# Patient Record
Sex: Female | Born: 1950 | Race: White | Hispanic: No
Health system: Southern US, Community
[De-identification: ages and names within clinical notes are randomized; demographics above are authoritative.]

---

## 1999-02-16 ENCOUNTER — Other Ambulatory Visit: Admission: RE | Admit: 1999-02-16 | Discharge: 1999-02-16 | Payer: Self-pay | Admitting: Obstetrics and Gynecology

## 2000-03-22 ENCOUNTER — Other Ambulatory Visit: Admission: RE | Admit: 2000-03-22 | Discharge: 2000-03-22 | Payer: Self-pay | Admitting: Obstetrics and Gynecology

## 2001-05-02 ENCOUNTER — Other Ambulatory Visit: Admission: RE | Admit: 2001-05-02 | Discharge: 2001-05-02 | Payer: Self-pay | Admitting: Obstetrics and Gynecology

## 2002-06-12 ENCOUNTER — Other Ambulatory Visit: Admission: RE | Admit: 2002-06-12 | Discharge: 2002-06-12 | Payer: Self-pay | Admitting: Obstetrics and Gynecology

## 2003-06-16 ENCOUNTER — Other Ambulatory Visit: Admission: RE | Admit: 2003-06-16 | Discharge: 2003-06-16 | Payer: Self-pay | Admitting: Obstetrics and Gynecology

## 2004-06-23 ENCOUNTER — Other Ambulatory Visit: Admission: RE | Admit: 2004-06-23 | Discharge: 2004-06-23 | Payer: Self-pay | Admitting: Obstetrics and Gynecology

## 2006-04-02 ENCOUNTER — Ambulatory Visit: Payer: Self-pay | Admitting: Internal Medicine

## 2006-04-04 ENCOUNTER — Ambulatory Visit: Payer: Self-pay | Admitting: Internal Medicine

## 2006-04-09 ENCOUNTER — Ambulatory Visit: Payer: Self-pay | Admitting: Internal Medicine

## 2006-04-09 LAB — CONVERTED CEMR LAB
ALT: 14 units/L (ref 0–40)
AST: 15 units/L (ref 0–37)
BUN: 10 mg/dL (ref 6–23)
Basophils Absolute: 0 10*3/uL (ref 0.0–0.1)
Calcium: 9.2 mg/dL (ref 8.4–10.5)
Chloride: 110 meq/L (ref 96–112)
Eosinophils Absolute: 0.1 10*3/uL (ref 0.0–0.6)
Eosinophils Relative: 2 % (ref 0.0–5.0)
GFR calc Af Amer: 133 mL/min
GFR calc non Af Amer: 110 mL/min
HCT: 38 % (ref 36.0–46.0)
HDL: 69.7 mg/dL (ref >39.0)
LDL Cholesterol: 91 mg/dL (ref 0–99)
Platelets: 239 10*3/uL (ref 150–400)
Potassium: 4 meq/L (ref 3.5–5.1)
Triglycerides: 52 mg/dL (ref 0–149)
VLDL: 10 mg/dL (ref 0–40)
Vit D, 1,25-Dihydroxy: 13 — ABNORMAL LOW (ref 20–57)
WBC: 2.7 10*3/uL — ABNORMAL LOW (ref 4.5–10.5)

## 2006-04-19 ENCOUNTER — Ambulatory Visit: Payer: Self-pay | Admitting: Internal Medicine

## 2006-04-19 LAB — CONVERTED CEMR LAB
Free T4: 0.7 ng/dL (ref 0.6–1.6)
Hgb A1c MFr Bld: 5.1 % (ref 4.6–6.0)
T3, Free: 3.4 pg/mL (ref 2.3–4.2)

## 2006-05-03 ENCOUNTER — Ambulatory Visit: Payer: Self-pay | Admitting: Internal Medicine

## 2006-05-08 ENCOUNTER — Encounter: Admission: RE | Admit: 2006-05-08 | Discharge: 2006-05-08 | Payer: Self-pay | Admitting: Internal Medicine

## 2006-05-13 ENCOUNTER — Ambulatory Visit: Payer: Self-pay | Admitting: Gastroenterology

## 2006-05-21 ENCOUNTER — Encounter: Payer: Self-pay | Admitting: Internal Medicine

## 2006-05-21 ENCOUNTER — Ambulatory Visit: Payer: Self-pay | Admitting: Internal Medicine

## 2006-05-23 DIAGNOSIS — C4370 Malignant melanoma of unspecified lower limb, including hip: Secondary | ICD-10-CM | POA: Insufficient documentation

## 2006-05-24 ENCOUNTER — Encounter: Payer: Self-pay | Admitting: Internal Medicine

## 2006-05-24 ENCOUNTER — Ambulatory Visit: Payer: Self-pay | Admitting: Gastroenterology

## 2006-05-24 ENCOUNTER — Encounter: Payer: Self-pay | Admitting: Gastroenterology

## 2006-06-27 ENCOUNTER — Encounter: Admission: RE | Admit: 2006-06-27 | Discharge: 2006-06-27 | Payer: Self-pay | Admitting: Internal Medicine

## 2006-06-29 ENCOUNTER — Encounter: Payer: Self-pay | Admitting: Internal Medicine

## 2006-06-29 DIAGNOSIS — E0590 Thyrotoxicosis, unspecified without thyrotoxic crisis or storm: Secondary | ICD-10-CM | POA: Insufficient documentation

## 2006-07-01 ENCOUNTER — Encounter (INDEPENDENT_AMBULATORY_CARE_PROVIDER_SITE_OTHER): Payer: Self-pay | Admitting: *Deleted

## 2006-07-01 ENCOUNTER — Encounter: Admission: RE | Admit: 2006-07-01 | Discharge: 2006-07-01 | Payer: Self-pay | Admitting: Internal Medicine

## 2006-07-11 ENCOUNTER — Ambulatory Visit: Payer: Self-pay | Admitting: Internal Medicine

## 2006-07-11 DIAGNOSIS — R51 Headache: Secondary | ICD-10-CM | POA: Insufficient documentation

## 2006-07-11 DIAGNOSIS — E559 Vitamin D deficiency, unspecified: Secondary | ICD-10-CM | POA: Insufficient documentation

## 2006-07-11 DIAGNOSIS — R42 Dizziness and giddiness: Secondary | ICD-10-CM | POA: Insufficient documentation

## 2006-07-11 DIAGNOSIS — R519 Headache, unspecified: Secondary | ICD-10-CM | POA: Insufficient documentation

## 2006-07-16 ENCOUNTER — Encounter (INDEPENDENT_AMBULATORY_CARE_PROVIDER_SITE_OTHER): Payer: Self-pay | Admitting: *Deleted

## 2006-08-12 ENCOUNTER — Telehealth: Payer: Self-pay | Admitting: Internal Medicine

## 2006-08-14 ENCOUNTER — Telehealth: Payer: Self-pay | Admitting: Internal Medicine

## 2006-08-15 ENCOUNTER — Ambulatory Visit: Payer: Self-pay | Admitting: Internal Medicine

## 2006-08-17 ENCOUNTER — Encounter: Payer: Self-pay | Admitting: Internal Medicine

## 2006-08-19 ENCOUNTER — Encounter (INDEPENDENT_AMBULATORY_CARE_PROVIDER_SITE_OTHER): Payer: Self-pay | Admitting: *Deleted

## 2006-12-02 ENCOUNTER — Telehealth (INDEPENDENT_AMBULATORY_CARE_PROVIDER_SITE_OTHER): Payer: Self-pay | Admitting: *Deleted

## 2006-12-02 DIAGNOSIS — E039 Hypothyroidism, unspecified: Secondary | ICD-10-CM | POA: Insufficient documentation

## 2006-12-04 ENCOUNTER — Ambulatory Visit: Payer: Self-pay | Admitting: Internal Medicine

## 2006-12-10 ENCOUNTER — Ambulatory Visit: Payer: Self-pay | Admitting: Internal Medicine

## 2007-02-17 ENCOUNTER — Telehealth (INDEPENDENT_AMBULATORY_CARE_PROVIDER_SITE_OTHER): Payer: Self-pay | Admitting: *Deleted

## 2007-03-21 ENCOUNTER — Ambulatory Visit: Payer: Self-pay | Admitting: Internal Medicine

## 2007-03-22 LAB — CONVERTED CEMR LAB: TSH: 5.5 microintl units/mL (ref 0.35–5.50)

## 2007-03-24 ENCOUNTER — Encounter (INDEPENDENT_AMBULATORY_CARE_PROVIDER_SITE_OTHER): Payer: Self-pay | Admitting: *Deleted

## 2007-03-25 LAB — CONVERTED CEMR LAB: Vit D, 1,25-Dihydroxy: 29 — ABNORMAL LOW (ref 30–89)

## 2007-03-26 ENCOUNTER — Encounter (INDEPENDENT_AMBULATORY_CARE_PROVIDER_SITE_OTHER): Payer: Self-pay | Admitting: *Deleted

## 2007-03-31 ENCOUNTER — Ambulatory Visit: Payer: Self-pay | Admitting: Internal Medicine

## 2007-03-31 DIAGNOSIS — K219 Gastro-esophageal reflux disease without esophagitis: Secondary | ICD-10-CM | POA: Insufficient documentation

## 2007-04-25 ENCOUNTER — Encounter (INDEPENDENT_AMBULATORY_CARE_PROVIDER_SITE_OTHER): Payer: Self-pay | Admitting: *Deleted

## 2007-05-20 ENCOUNTER — Encounter: Payer: Self-pay | Admitting: Internal Medicine

## 2007-05-23 ENCOUNTER — Telehealth (INDEPENDENT_AMBULATORY_CARE_PROVIDER_SITE_OTHER): Payer: Self-pay | Admitting: *Deleted

## 2007-06-20 ENCOUNTER — Encounter: Payer: Self-pay | Admitting: Internal Medicine

## 2007-12-18 ENCOUNTER — Encounter: Payer: Self-pay | Admitting: Internal Medicine

## 2008-11-19 ENCOUNTER — Ambulatory Visit (HOSPITAL_COMMUNITY): Admission: RE | Admit: 2008-11-19 | Discharge: 2008-11-19 | Payer: Self-pay | Admitting: Obstetrics and Gynecology

## 2009-04-29 ENCOUNTER — Encounter (INDEPENDENT_AMBULATORY_CARE_PROVIDER_SITE_OTHER): Payer: Self-pay | Admitting: *Deleted

## 2009-08-03 ENCOUNTER — Encounter: Admission: RE | Admit: 2009-08-03 | Discharge: 2009-08-03 | Payer: Self-pay | Admitting: Family Medicine

## 2010-01-31 NOTE — Letter (Signed)
Summary: Colonoscopy Letter  [facility]  [address] [place], [place] [address]   Phone: [phone]  Fax: [phone]      April 29, 2009 MRN: [id number]   MARLISSA EMERICK [address] [place], [place]  [address]   Dear Ms. CHRISTLEY,   According to your medical record, it is time for you to schedule a Colonoscopy. The American Cancer Society recommends this procedure as a method to detect early colon cancer. Patients with a family history of colon cancer, or a personal history of colon polyps or inflammatory bowel disease are at increased risk.  This letter has beeen generated based on the recommendations made at the time of your procedure. If you feel that in your particular situation this may no longer apply, please contact our office.  Please call our office at [phone] to schedule this appointment or to update your records at your earliest convenience.  Thank you for cooperating with [place] to provide you with the very best care possible.   Sincerely,  Judie Petit T. Russella Dar, M.D.  [facility] [phone]

## 2010-03-03 ENCOUNTER — Other Ambulatory Visit: Payer: Self-pay | Admitting: Obstetrics and Gynecology

## 2010-04-05 LAB — CBC
HCT: 37.4 % (ref 36.0–46.0)
Hemoglobin: 12.9 g/dL (ref 12.0–15.0)
MCV: 97.9 fL (ref 78.0–100.0)
Platelets: 236 10*3/uL (ref 150–400)
RBC: 3.82 MIL/uL — ABNORMAL LOW (ref 3.87–5.11)
RDW: 13 % (ref 11.5–15.5)
WBC: 9.9 10*3/uL (ref 4.0–10.5)

## 2010-04-05 LAB — BASIC METABOLIC PANEL
Creatinine, Ser: 0.79 mg/dL (ref 0.4–1.2)
Glucose, Bld: 92 mg/dL (ref 70–99)
Sodium: 139 mEq/L (ref 135–145)

## 2010-04-05 LAB — PROTIME-INR
INR: 1.05 (ref 0.00–1.49)
Prothrombin Time: 13.6 seconds (ref 11.6–15.2)

## 2010-05-19 NOTE — Assessment & Plan Note (Signed)
[facility]                        [place] OFFICE NOTE   Erin, Parks                     MRN:          [id number]  DATE:04/04/2006                            DOB:          [birth year]    Erin Parks was seen April 04, 2006 to establish as a new patient  and for a physical exam.   She is concerned about her general health and completing some  surveillance studies. 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 Additionally she has never had a bone density. She is not on  vitamin D or calcium.   She has a significant past history. At age 4 she broke her right ankle &  while wearing the cast  she fell on a chair and broke her hip. The cast  apparently resulted in some skin change requiring skin grafting. She  also in college had some tendon surgery of the right foot to address  complications of  the prior orthopedic issues mentioned above. She is  gravida 2, para 2. She had one C section. 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 She had no recent  Derm surveys as there  were no  new skin lesions for over 10 years.   FAMILY HISTORY:  Negative for diabetes and stroke. Her father had  bladder cancer; he had a pacemaker. There is a family history of  hypertension. Her mother had hypertension later in life.   She smoked from 1968 to 1980 less than a half pack per day. She has 1-2  glasses of wine daily on average.   She is intolerant or allergic to SULFA and CODEINE.   She is on Prempro from Dr. Miguel Aschoff.   Her weight has fluctuated some related to holidays and diet change.   She does exercise 4 days a week walking and running at least 30 minutes  without cardiopulmonary symptoms.   She is on no specific diet.   She does have chronic paresthesias of the second and third toes of the  right foot  since the ortho  surgery.   She also has symptoms of serous otitis on the right related to  respiratory tract infection in December 2007.   The remainder of the review of systems is essentially negative.   GENERAL:  She is 5 foot 3 and weighs 122 fully clothed.  VITAL SIGNS:  Pulse was 60 and regular, respiratory rate 14 and blood  pressure initially was 140/94.  Fundal exam revealed no abnormal vasculature. Nares were patent. Dental  hygiene is excellent. The tympanic membrane is dull but there is no  evidence of infection or inflammation.  LYMPH:  She has no lymphadenopathy about the head, neck or axilla.  NECK:  The thyroid is normal size but slightly asymmetric with the right  lobe greater than the left. I cannot appreciate any nodules.  HEART:  She does have a grade 1 crescendo-decrescendo murmur loudest at  the apex.  CHEST:  Clear with no increased work  of breathing.  ABDOMEN:  She has no organomegaly or masses.  EXTREMITIES:  Range of motion is normal. All pulses are intact and there  is no edema. There is evidence of a prior surgery with some scarring  over the anterior aspect of the right ankle.  NEUROPSYCHIATRIC:  Normal.   Her EKG is normal.   Fasting labs would be recommended; by history she has had normal glucose  and lipids but these were done remotely.   Additionally surveillance studies will be scheduled for colonoscopy and  bone density.   She will be asked to monitor her blood pressure. 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 If the blood pressure remains elevated at  school i.e. over 130/85, then I would recommend purchasing a home cuff  and monitoring it.   The heart murmur simply needs monitoring annually. It is not a click  murmur and her dentist may wish to give her SBE prophylaxis.   Chronic serous otitis changes of the right ear would most likely require  placement of an otic tube if this is problematic enough for her.   Copies of fasting labs will be  sent to her for her home file. A goal  sheet will be provided.     Titus Dubin. Alwyn Ren, MD,FACP,FCCP  Electronically Signed    [facility]  DD: 04/04/2006  DT: 04/04/2006  Job #: [id number]

## 2010-07-21 ENCOUNTER — Telehealth: Payer: Self-pay

## 2010-07-27 NOTE — Telephone Encounter (Signed)
From: Marylynn Pearson    Sent: 07/27/2010   8:59 AM      To: Christie Nottingham, CMA  Patient switched gi doctors.

## 2011-03-06 ENCOUNTER — Other Ambulatory Visit: Payer: Self-pay | Admitting: Gastroenterology

## 2011-03-06 DIAGNOSIS — F458 Other somatoform disorders: Secondary | ICD-10-CM

## 2011-03-08 ENCOUNTER — Ambulatory Visit
Admission: RE | Admit: 2011-03-08 | Discharge: 2011-03-08 | Disposition: A | Discharge status: Home / Self Care | Payer: BC Managed Care – PPO | Source: Ambulatory Visit | Attending: Gastroenterology | Admitting: Gastroenterology

## 2011-03-08 DIAGNOSIS — R0989 Other specified symptoms and signs involving the circulatory and respiratory systems: Secondary | ICD-10-CM

## 2011-03-08 DIAGNOSIS — F458 Other somatoform disorders: Secondary | ICD-10-CM

## 2011-03-08 DIAGNOSIS — R198 Other specified symptoms and signs involving the digestive system and abdomen: Secondary | ICD-10-CM

## 2012-05-21 ENCOUNTER — Other Ambulatory Visit: Payer: Self-pay | Admitting: Obstetrics and Gynecology

## 2012-05-21 DIAGNOSIS — R928 Other abnormal and inconclusive findings on diagnostic imaging of breast: Secondary | ICD-10-CM

## 2012-05-23 ENCOUNTER — Ambulatory Visit
Admission: RE | Admit: 2012-05-23 | Discharge: 2012-05-23 | Disposition: A | Discharge status: Home / Self Care | Payer: BC Managed Care – PPO | Source: Ambulatory Visit | Attending: Obstetrics and Gynecology | Admitting: Obstetrics and Gynecology

## 2012-05-23 DIAGNOSIS — R928 Other abnormal and inconclusive findings on diagnostic imaging of breast: Secondary | ICD-10-CM

## 2012-06-03 ENCOUNTER — Other Ambulatory Visit: Payer: BC Managed Care – PPO

## 2012-12-31 ENCOUNTER — Other Ambulatory Visit: Payer: Self-pay | Admitting: Obstetrics and Gynecology

## 2012-12-31 DIAGNOSIS — N6310 Unspecified lump in the right breast, unspecified quadrant: Secondary | ICD-10-CM

## 2013-01-07 ENCOUNTER — Ambulatory Visit
Admission: RE | Admit: 2013-01-07 | Discharge: 2013-01-07 | Disposition: A | Discharge status: Home / Self Care | Payer: BC Managed Care – PPO | Source: Ambulatory Visit | Attending: Obstetrics and Gynecology | Admitting: Obstetrics and Gynecology

## 2013-01-07 ENCOUNTER — Other Ambulatory Visit: Payer: Self-pay | Admitting: Obstetrics and Gynecology

## 2013-01-07 DIAGNOSIS — N6310 Unspecified lump in the right breast, unspecified quadrant: Secondary | ICD-10-CM

## 2015-07-20 ENCOUNTER — Other Ambulatory Visit: Payer: Self-pay | Admitting: Obstetrics and Gynecology

## 2015-07-21 LAB — CYTOLOGY - PAP

## 2016-02-08 DIAGNOSIS — G4700 Insomnia, unspecified: Secondary | ICD-10-CM | POA: Diagnosis not present

## 2016-02-08 DIAGNOSIS — Z0000 Encounter for general adult medical examination without abnormal findings: Secondary | ICD-10-CM | POA: Diagnosis not present

## 2016-02-08 DIAGNOSIS — E7800 Pure hypercholesterolemia, unspecified: Secondary | ICD-10-CM | POA: Diagnosis not present

## 2016-02-08 DIAGNOSIS — I10 Essential (primary) hypertension: Secondary | ICD-10-CM | POA: Diagnosis not present

## 2016-03-26 DIAGNOSIS — R69 Illness, unspecified: Secondary | ICD-10-CM | POA: Diagnosis not present

## 2016-03-26 DIAGNOSIS — I10 Essential (primary) hypertension: Secondary | ICD-10-CM | POA: Diagnosis not present

## 2016-04-02 DIAGNOSIS — R69 Illness, unspecified: Secondary | ICD-10-CM | POA: Diagnosis not present

## 2016-04-17 DIAGNOSIS — L989 Disorder of the skin and subcutaneous tissue, unspecified: Secondary | ICD-10-CM | POA: Diagnosis not present

## 2016-04-25 DIAGNOSIS — I10 Essential (primary) hypertension: Secondary | ICD-10-CM | POA: Diagnosis not present

## 2016-04-25 DIAGNOSIS — R229 Localized swelling, mass and lump, unspecified: Secondary | ICD-10-CM | POA: Diagnosis not present

## 2016-04-26 DIAGNOSIS — H5213 Myopia, bilateral: Secondary | ICD-10-CM | POA: Diagnosis not present

## 2016-04-26 DIAGNOSIS — Z0100 Encounter for examination of eyes and vision without abnormal findings: Secondary | ICD-10-CM | POA: Diagnosis not present

## 2016-05-23 DIAGNOSIS — Z86010 Personal history of colonic polyps: Secondary | ICD-10-CM | POA: Diagnosis not present

## 2016-05-31 DIAGNOSIS — Z0100 Encounter for examination of eyes and vision without abnormal findings: Secondary | ICD-10-CM | POA: Diagnosis not present

## 2016-06-18 DIAGNOSIS — E890 Postprocedural hypothyroidism: Secondary | ICD-10-CM | POA: Diagnosis not present

## 2016-06-18 DIAGNOSIS — E559 Vitamin D deficiency, unspecified: Secondary | ICD-10-CM | POA: Diagnosis not present

## 2016-06-18 DIAGNOSIS — I10 Essential (primary) hypertension: Secondary | ICD-10-CM | POA: Diagnosis not present

## 2016-06-25 DIAGNOSIS — I10 Essential (primary) hypertension: Secondary | ICD-10-CM | POA: Diagnosis not present

## 2016-06-25 DIAGNOSIS — E890 Postprocedural hypothyroidism: Secondary | ICD-10-CM | POA: Diagnosis not present

## 2016-06-25 DIAGNOSIS — E559 Vitamin D deficiency, unspecified: Secondary | ICD-10-CM | POA: Diagnosis not present

## 2016-08-23 DIAGNOSIS — D1801 Hemangioma of skin and subcutaneous tissue: Secondary | ICD-10-CM | POA: Diagnosis not present

## 2016-08-23 DIAGNOSIS — D225 Melanocytic nevi of trunk: Secondary | ICD-10-CM | POA: Diagnosis not present

## 2016-08-23 DIAGNOSIS — L821 Other seborrheic keratosis: Secondary | ICD-10-CM | POA: Diagnosis not present

## 2016-08-23 DIAGNOSIS — L814 Other melanin hyperpigmentation: Secondary | ICD-10-CM | POA: Diagnosis not present

## 2016-08-23 DIAGNOSIS — Z85820 Personal history of malignant melanoma of skin: Secondary | ICD-10-CM | POA: Diagnosis not present

## 2016-08-24 DIAGNOSIS — Z124 Encounter for screening for malignant neoplasm of cervix: Secondary | ICD-10-CM | POA: Diagnosis not present

## 2016-08-24 DIAGNOSIS — Z01419 Encounter for gynecological examination (general) (routine) without abnormal findings: Secondary | ICD-10-CM | POA: Diagnosis not present

## 2016-08-24 DIAGNOSIS — Z6823 Body mass index (BMI) 23.0-23.9, adult: Secondary | ICD-10-CM | POA: Diagnosis not present

## 2016-08-24 DIAGNOSIS — Z1231 Encounter for screening mammogram for malignant neoplasm of breast: Secondary | ICD-10-CM | POA: Diagnosis not present

## 2016-10-22 DIAGNOSIS — Z23 Encounter for immunization: Secondary | ICD-10-CM | POA: Diagnosis not present

## 2016-10-22 DIAGNOSIS — G4700 Insomnia, unspecified: Secondary | ICD-10-CM | POA: Diagnosis not present

## 2016-10-22 DIAGNOSIS — I10 Essential (primary) hypertension: Secondary | ICD-10-CM | POA: Diagnosis not present

## 2017-02-18 DIAGNOSIS — G4700 Insomnia, unspecified: Secondary | ICD-10-CM | POA: Diagnosis not present

## 2017-02-18 DIAGNOSIS — I10 Essential (primary) hypertension: Secondary | ICD-10-CM | POA: Diagnosis not present

## 2017-02-18 DIAGNOSIS — Z1159 Encounter for screening for other viral diseases: Secondary | ICD-10-CM | POA: Diagnosis not present

## 2017-02-18 DIAGNOSIS — Z0000 Encounter for general adult medical examination without abnormal findings: Secondary | ICD-10-CM | POA: Diagnosis not present

## 2017-02-18 DIAGNOSIS — E7800 Pure hypercholesterolemia, unspecified: Secondary | ICD-10-CM | POA: Diagnosis not present

## 2017-02-18 DIAGNOSIS — H9190 Unspecified hearing loss, unspecified ear: Secondary | ICD-10-CM | POA: Diagnosis not present

## 2017-02-18 DIAGNOSIS — Z658 Other specified problems related to psychosocial circumstances: Secondary | ICD-10-CM | POA: Diagnosis not present

## 2017-03-27 DIAGNOSIS — K130 Diseases of lips: Secondary | ICD-10-CM | POA: Diagnosis not present

## 2017-04-05 DIAGNOSIS — K130 Diseases of lips: Secondary | ICD-10-CM | POA: Diagnosis not present

## 2017-06-03 DIAGNOSIS — H9313 Tinnitus, bilateral: Secondary | ICD-10-CM | POA: Diagnosis not present

## 2017-06-03 DIAGNOSIS — H903 Sensorineural hearing loss, bilateral: Secondary | ICD-10-CM | POA: Diagnosis not present

## 2017-06-05 ENCOUNTER — Other Ambulatory Visit: Payer: Self-pay | Admitting: Otolaryngology

## 2017-06-05 DIAGNOSIS — H9313 Tinnitus, bilateral: Secondary | ICD-10-CM

## 2017-06-05 DIAGNOSIS — H903 Sensorineural hearing loss, bilateral: Secondary | ICD-10-CM

## 2017-06-13 DIAGNOSIS — H3540 Unspecified peripheral retinal degeneration: Secondary | ICD-10-CM | POA: Diagnosis not present

## 2017-06-13 DIAGNOSIS — H524 Presbyopia: Secondary | ICD-10-CM | POA: Diagnosis not present

## 2017-06-13 DIAGNOSIS — Z0100 Encounter for examination of eyes and vision without abnormal findings: Secondary | ICD-10-CM | POA: Diagnosis not present

## 2017-06-13 DIAGNOSIS — H43393 Other vitreous opacities, bilateral: Secondary | ICD-10-CM | POA: Diagnosis not present

## 2017-06-13 DIAGNOSIS — H25813 Combined forms of age-related cataract, bilateral: Secondary | ICD-10-CM | POA: Diagnosis not present

## 2017-06-13 DIAGNOSIS — H5203 Hypermetropia, bilateral: Secondary | ICD-10-CM | POA: Diagnosis not present

## 2017-06-13 DIAGNOSIS — H52223 Regular astigmatism, bilateral: Secondary | ICD-10-CM | POA: Diagnosis not present

## 2017-06-14 ENCOUNTER — Ambulatory Visit
Admission: RE | Admit: 2017-06-14 | Discharge: 2017-06-14 | Disposition: A | Discharge status: Home / Self Care | Payer: Medicare HMO | Source: Ambulatory Visit | Attending: Otolaryngology | Admitting: Otolaryngology

## 2017-06-14 DIAGNOSIS — H9313 Tinnitus, bilateral: Secondary | ICD-10-CM

## 2017-06-14 DIAGNOSIS — H903 Sensorineural hearing loss, bilateral: Secondary | ICD-10-CM

## 2017-06-14 MED ORDER — GADOBENATE DIMEGLUMINE 529 MG/ML IV SOLN
10.0000 mL | Freq: Once | INTRAVENOUS | Status: AC | PRN
  Administered 2017-06-14: 10 mL via INTRAVENOUS

## 2017-06-18 DIAGNOSIS — I10 Essential (primary) hypertension: Secondary | ICD-10-CM | POA: Diagnosis not present

## 2017-06-18 DIAGNOSIS — E559 Vitamin D deficiency, unspecified: Secondary | ICD-10-CM | POA: Diagnosis not present

## 2017-06-18 DIAGNOSIS — E890 Postprocedural hypothyroidism: Secondary | ICD-10-CM | POA: Diagnosis not present

## 2017-06-25 DIAGNOSIS — M2550 Pain in unspecified joint: Secondary | ICD-10-CM | POA: Diagnosis not present

## 2017-06-25 DIAGNOSIS — E559 Vitamin D deficiency, unspecified: Secondary | ICD-10-CM | POA: Diagnosis not present

## 2017-06-25 DIAGNOSIS — I10 Essential (primary) hypertension: Secondary | ICD-10-CM | POA: Diagnosis not present

## 2017-06-25 DIAGNOSIS — E890 Postprocedural hypothyroidism: Secondary | ICD-10-CM | POA: Diagnosis not present

## 2017-06-28 DIAGNOSIS — M25562 Pain in left knee: Secondary | ICD-10-CM | POA: Diagnosis not present

## 2017-06-28 DIAGNOSIS — M25551 Pain in right hip: Secondary | ICD-10-CM | POA: Diagnosis not present

## 2017-06-28 DIAGNOSIS — M545 Low back pain: Secondary | ICD-10-CM | POA: Diagnosis not present

## 2017-06-28 DIAGNOSIS — M47817 Spondylosis without myelopathy or radiculopathy, lumbosacral region: Secondary | ICD-10-CM | POA: Diagnosis not present

## 2017-06-28 DIAGNOSIS — M25561 Pain in right knee: Secondary | ICD-10-CM | POA: Diagnosis not present

## 2017-07-19 DIAGNOSIS — M47817 Spondylosis without myelopathy or radiculopathy, lumbosacral region: Secondary | ICD-10-CM | POA: Diagnosis not present

## 2017-07-19 DIAGNOSIS — M25561 Pain in right knee: Secondary | ICD-10-CM | POA: Diagnosis not present

## 2017-07-19 DIAGNOSIS — M25551 Pain in right hip: Secondary | ICD-10-CM | POA: Diagnosis not present

## 2017-07-19 DIAGNOSIS — M25562 Pain in left knee: Secondary | ICD-10-CM | POA: Diagnosis not present

## 2017-08-05 DIAGNOSIS — S60021A Contusion of right index finger without damage to nail, initial encounter: Secondary | ICD-10-CM | POA: Diagnosis not present

## 2017-08-20 DIAGNOSIS — M47817 Spondylosis without myelopathy or radiculopathy, lumbosacral region: Secondary | ICD-10-CM | POA: Diagnosis not present

## 2017-08-20 DIAGNOSIS — M545 Low back pain: Secondary | ICD-10-CM | POA: Diagnosis not present

## 2017-08-26 DIAGNOSIS — D1801 Hemangioma of skin and subcutaneous tissue: Secondary | ICD-10-CM | POA: Diagnosis not present

## 2017-08-26 DIAGNOSIS — L821 Other seborrheic keratosis: Secondary | ICD-10-CM | POA: Diagnosis not present

## 2017-08-26 DIAGNOSIS — D225 Melanocytic nevi of trunk: Secondary | ICD-10-CM | POA: Diagnosis not present

## 2017-08-26 DIAGNOSIS — L814 Other melanin hyperpigmentation: Secondary | ICD-10-CM | POA: Diagnosis not present

## 2017-08-26 DIAGNOSIS — Z85820 Personal history of malignant melanoma of skin: Secondary | ICD-10-CM | POA: Diagnosis not present

## 2017-08-26 DIAGNOSIS — Z1231 Encounter for screening mammogram for malignant neoplasm of breast: Secondary | ICD-10-CM | POA: Diagnosis not present

## 2017-08-26 DIAGNOSIS — B351 Tinea unguium: Secondary | ICD-10-CM | POA: Diagnosis not present

## 2017-08-28 DIAGNOSIS — M545 Low back pain: Secondary | ICD-10-CM | POA: Diagnosis not present

## 2017-09-04 DIAGNOSIS — M545 Low back pain: Secondary | ICD-10-CM | POA: Diagnosis not present

## 2017-09-12 DIAGNOSIS — M545 Low back pain: Secondary | ICD-10-CM | POA: Diagnosis not present

## 2017-09-16 DIAGNOSIS — M545 Low back pain: Secondary | ICD-10-CM | POA: Diagnosis not present

## 2017-09-18 DIAGNOSIS — M545 Low back pain: Secondary | ICD-10-CM | POA: Diagnosis not present

## 2017-09-25 DIAGNOSIS — M545 Low back pain: Secondary | ICD-10-CM | POA: Diagnosis not present

## 2018-01-07 DIAGNOSIS — R69 Illness, unspecified: Secondary | ICD-10-CM | POA: Diagnosis not present

## 2018-02-28 DIAGNOSIS — I10 Essential (primary) hypertension: Secondary | ICD-10-CM | POA: Diagnosis not present

## 2018-02-28 DIAGNOSIS — H9190 Unspecified hearing loss, unspecified ear: Secondary | ICD-10-CM | POA: Diagnosis not present

## 2018-02-28 DIAGNOSIS — E039 Hypothyroidism, unspecified: Secondary | ICD-10-CM | POA: Diagnosis not present

## 2018-02-28 DIAGNOSIS — G4700 Insomnia, unspecified: Secondary | ICD-10-CM | POA: Diagnosis not present

## 2018-02-28 DIAGNOSIS — E7800 Pure hypercholesterolemia, unspecified: Secondary | ICD-10-CM | POA: Diagnosis not present

## 2018-02-28 DIAGNOSIS — Z0000 Encounter for general adult medical examination without abnormal findings: Secondary | ICD-10-CM | POA: Diagnosis not present

## 2018-06-19 DIAGNOSIS — E890 Postprocedural hypothyroidism: Secondary | ICD-10-CM | POA: Diagnosis not present

## 2018-06-19 DIAGNOSIS — I10 Essential (primary) hypertension: Secondary | ICD-10-CM | POA: Diagnosis not present

## 2018-06-19 DIAGNOSIS — E559 Vitamin D deficiency, unspecified: Secondary | ICD-10-CM | POA: Diagnosis not present

## 2018-06-26 DIAGNOSIS — E890 Postprocedural hypothyroidism: Secondary | ICD-10-CM | POA: Diagnosis not present

## 2018-06-26 DIAGNOSIS — E559 Vitamin D deficiency, unspecified: Secondary | ICD-10-CM | POA: Diagnosis not present

## 2018-06-26 DIAGNOSIS — I10 Essential (primary) hypertension: Secondary | ICD-10-CM | POA: Diagnosis not present

## 2018-06-26 DIAGNOSIS — N951 Menopausal and female climacteric states: Secondary | ICD-10-CM | POA: Diagnosis not present

## 2018-07-02 DIAGNOSIS — M8589 Other specified disorders of bone density and structure, multiple sites: Secondary | ICD-10-CM | POA: Diagnosis not present

## 2018-08-26 DIAGNOSIS — Z85820 Personal history of malignant melanoma of skin: Secondary | ICD-10-CM | POA: Diagnosis not present

## 2018-08-26 DIAGNOSIS — D225 Melanocytic nevi of trunk: Secondary | ICD-10-CM | POA: Diagnosis not present

## 2018-08-26 DIAGNOSIS — L603 Nail dystrophy: Secondary | ICD-10-CM | POA: Diagnosis not present

## 2018-08-26 DIAGNOSIS — L820 Inflamed seborrheic keratosis: Secondary | ICD-10-CM | POA: Diagnosis not present

## 2018-08-26 DIAGNOSIS — L821 Other seborrheic keratosis: Secondary | ICD-10-CM | POA: Diagnosis not present

## 2018-08-26 DIAGNOSIS — L814 Other melanin hyperpigmentation: Secondary | ICD-10-CM | POA: Diagnosis not present

## 2018-08-26 DIAGNOSIS — L720 Epidermal cyst: Secondary | ICD-10-CM | POA: Diagnosis not present

## 2018-08-26 DIAGNOSIS — L570 Actinic keratosis: Secondary | ICD-10-CM | POA: Diagnosis not present

## 2018-09-03 DIAGNOSIS — Z1231 Encounter for screening mammogram for malignant neoplasm of breast: Secondary | ICD-10-CM | POA: Diagnosis not present

## 2018-09-03 DIAGNOSIS — Z01419 Encounter for gynecological examination (general) (routine) without abnormal findings: Secondary | ICD-10-CM | POA: Diagnosis not present

## 2018-09-25 DIAGNOSIS — H524 Presbyopia: Secondary | ICD-10-CM | POA: Diagnosis not present

## 2018-09-25 DIAGNOSIS — H5203 Hypermetropia, bilateral: Secondary | ICD-10-CM | POA: Diagnosis not present

## 2018-09-25 DIAGNOSIS — H25813 Combined forms of age-related cataract, bilateral: Secondary | ICD-10-CM | POA: Diagnosis not present

## 2018-09-25 DIAGNOSIS — H52223 Regular astigmatism, bilateral: Secondary | ICD-10-CM | POA: Diagnosis not present

## 2019-01-26 ENCOUNTER — Ambulatory Visit: Payer: BC Managed Care – PPO | Attending: Internal Medicine

## 2019-01-26 DIAGNOSIS — Z23 Encounter for immunization: Secondary | ICD-10-CM | POA: Insufficient documentation

## 2019-01-26 NOTE — Progress Notes (Signed)
   Covid-19 Vaccination Clinic  Name:  Erin Parks    MRN: [id number] DOB: [birth year]  01/26/2019  Erin Parks was observed post Covid-19 immunization for 15 minutes without incidence. She was provided with Vaccine Information Sheet and instruction to access the V-Safe system.   Erin Parks was instructed to call 911 with any severe reactions post vaccine: Marland Kitchen Difficulty breathing  . Swelling of your face and throat  . A fast heartbeat  . A bad rash all over your body  . Dizziness and weakness    Immunizations Administered    Name Date Dose VIS Date Route   Pfizer COVID-19 Vaccine 01/26/2019 11:32 AM 0.3 mL 12/12/2018 Intramuscular   Manufacturer: [place]   Lot: [id number]   [place]: [id number]

## 2019-01-29 ENCOUNTER — Ambulatory Visit: Payer: BC Managed Care – PPO

## 2019-02-16 ENCOUNTER — Ambulatory Visit: Payer: BC Managed Care – PPO | Attending: Internal Medicine

## 2019-02-16 DIAGNOSIS — Z23 Encounter for immunization: Secondary | ICD-10-CM | POA: Insufficient documentation

## 2019-02-16 NOTE — Progress Notes (Signed)
   Covid-19 Vaccination Clinic  Name:  LASHANNON TORTORICH    MRN: [id number] DOB: [birth year]  02/16/2019  Ms. Rachal was observed post Covid-19 immunization for 15 minutes without incidence. She was provided with Vaccine Information Sheet and instruction to access the V-Safe system.   Ms. Windholz was instructed to call 911 with any severe reactions post vaccine: Marland Kitchen Difficulty breathing  . Swelling of your face and throat  . A fast heartbeat  . A bad rash all over your body  . Dizziness and weakness    Immunizations Administered    Name Date Dose VIS Date Route   Pfizer COVID-19 Vaccine 02/16/2019 11:31 AM 0.3 mL 12/12/2018 Intramuscular   Manufacturer: [place]   Lot: [id number]   [place]: [id number]

## 2019-03-17 DIAGNOSIS — E039 Hypothyroidism, unspecified: Secondary | ICD-10-CM | POA: Diagnosis not present

## 2019-03-17 DIAGNOSIS — I10 Essential (primary) hypertension: Secondary | ICD-10-CM | POA: Diagnosis not present

## 2019-03-17 DIAGNOSIS — E7800 Pure hypercholesterolemia, unspecified: Secondary | ICD-10-CM | POA: Diagnosis not present

## 2019-03-17 DIAGNOSIS — M810 Age-related osteoporosis without current pathological fracture: Secondary | ICD-10-CM | POA: Diagnosis not present

## 2019-03-17 DIAGNOSIS — G4700 Insomnia, unspecified: Secondary | ICD-10-CM | POA: Diagnosis not present

## 2019-03-17 DIAGNOSIS — M62838 Other muscle spasm: Secondary | ICD-10-CM | POA: Diagnosis not present

## 2019-03-17 DIAGNOSIS — Z0000 Encounter for general adult medical examination without abnormal findings: Secondary | ICD-10-CM | POA: Diagnosis not present

## 2019-04-17 DIAGNOSIS — D72819 Decreased white blood cell count, unspecified: Secondary | ICD-10-CM | POA: Diagnosis not present

## 2019-05-04 DIAGNOSIS — L820 Inflamed seborrheic keratosis: Secondary | ICD-10-CM | POA: Diagnosis not present

## 2019-05-04 DIAGNOSIS — C44311 Basal cell carcinoma of skin of nose: Secondary | ICD-10-CM | POA: Diagnosis not present

## 2019-05-04 DIAGNOSIS — D485 Neoplasm of uncertain behavior of skin: Secondary | ICD-10-CM | POA: Diagnosis not present

## 2019-05-04 DIAGNOSIS — D220 Melanocytic nevi of lip: Secondary | ICD-10-CM | POA: Diagnosis not present

## 2019-05-18 DIAGNOSIS — L905 Scar conditions and fibrosis of skin: Secondary | ICD-10-CM | POA: Diagnosis not present

## 2019-05-18 DIAGNOSIS — L821 Other seborrheic keratosis: Secondary | ICD-10-CM | POA: Diagnosis not present

## 2019-05-27 DIAGNOSIS — C44311 Basal cell carcinoma of skin of nose: Secondary | ICD-10-CM | POA: Diagnosis not present

## 2019-06-10 DIAGNOSIS — C44311 Basal cell carcinoma of skin of nose: Secondary | ICD-10-CM | POA: Diagnosis not present

## 2019-06-18 DIAGNOSIS — E890 Postprocedural hypothyroidism: Secondary | ICD-10-CM | POA: Diagnosis not present

## 2019-06-18 DIAGNOSIS — E559 Vitamin D deficiency, unspecified: Secondary | ICD-10-CM | POA: Diagnosis not present

## 2019-06-18 DIAGNOSIS — I10 Essential (primary) hypertension: Secondary | ICD-10-CM | POA: Diagnosis not present

## 2019-06-25 DIAGNOSIS — R5383 Other fatigue: Secondary | ICD-10-CM | POA: Diagnosis not present

## 2019-06-25 DIAGNOSIS — E890 Postprocedural hypothyroidism: Secondary | ICD-10-CM | POA: Diagnosis not present

## 2019-06-25 DIAGNOSIS — I10 Essential (primary) hypertension: Secondary | ICD-10-CM | POA: Diagnosis not present

## 2019-06-25 DIAGNOSIS — E559 Vitamin D deficiency, unspecified: Secondary | ICD-10-CM | POA: Diagnosis not present

## 2019-06-25 DIAGNOSIS — M8580 Other specified disorders of bone density and structure, unspecified site: Secondary | ICD-10-CM | POA: Diagnosis not present

## 2019-06-25 DIAGNOSIS — N951 Menopausal and female climacteric states: Secondary | ICD-10-CM | POA: Diagnosis not present

## 2019-06-25 DIAGNOSIS — Z79899 Other long term (current) drug therapy: Secondary | ICD-10-CM | POA: Diagnosis not present

## 2019-08-27 DIAGNOSIS — Z85820 Personal history of malignant melanoma of skin: Secondary | ICD-10-CM | POA: Diagnosis not present

## 2019-08-27 DIAGNOSIS — D225 Melanocytic nevi of trunk: Secondary | ICD-10-CM | POA: Diagnosis not present

## 2019-08-27 DIAGNOSIS — L905 Scar conditions and fibrosis of skin: Secondary | ICD-10-CM | POA: Diagnosis not present

## 2019-08-27 DIAGNOSIS — L814 Other melanin hyperpigmentation: Secondary | ICD-10-CM | POA: Diagnosis not present

## 2019-08-27 DIAGNOSIS — L821 Other seborrheic keratosis: Secondary | ICD-10-CM | POA: Diagnosis not present

## 2019-08-27 DIAGNOSIS — L570 Actinic keratosis: Secondary | ICD-10-CM | POA: Diagnosis not present

## 2019-08-27 DIAGNOSIS — D1801 Hemangioma of skin and subcutaneous tissue: Secondary | ICD-10-CM | POA: Diagnosis not present

## 2019-08-27 DIAGNOSIS — Z85828 Personal history of other malignant neoplasm of skin: Secondary | ICD-10-CM | POA: Diagnosis not present

## 2019-09-23 DIAGNOSIS — Z01419 Encounter for gynecological examination (general) (routine) without abnormal findings: Secondary | ICD-10-CM | POA: Diagnosis not present

## 2019-09-23 DIAGNOSIS — Z1231 Encounter for screening mammogram for malignant neoplasm of breast: Secondary | ICD-10-CM | POA: Diagnosis not present

## 2019-10-28 DIAGNOSIS — H524 Presbyopia: Secondary | ICD-10-CM | POA: Diagnosis not present

## 2019-12-28 DIAGNOSIS — Z0100 Encounter for examination of eyes and vision without abnormal findings: Secondary | ICD-10-CM | POA: Diagnosis not present

## 2020-01-01 IMAGING — MR MR HEAD WO/W CM
11 of 12 series · 37 of 48 positions shown · IV contrast (10ml Multihance)
Comparison: None.

CLINICAL DATA: Bilateral tinnitus.  Bilateral sensory hearing loss

Creatinine was obtained on site at [HOSPITAL] at [HOSPITAL].
Results: Creatinine 0.7 mg/dL.
EXAM:
MRI HEAD WITHOUT AND WITH CONTRAST
TECHNIQUE: Multiplanar, multiecho pulse sequences of the brain and surrounding
structures were obtained without and with intravenous contrast.
CONTRAST:  10mL MULTIHANCE GADOBENATE DIMEGLUMINE 529 MG/ML IV SOLN

[Series 2: T1 · sagittal · 5.0mm · 0.45mm/px · 1 of 22 slices shown (1 of 4)]
[im 1/22]
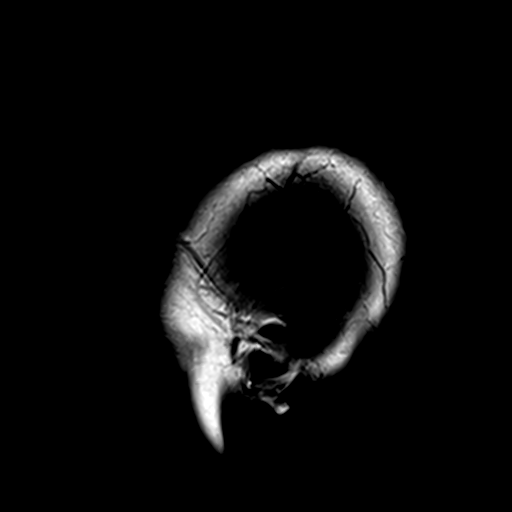

[Series 3: ep2d_diff_(id)_trace · axial · 3.0mm · 1.80mm/px · z∈[-86,+61]mm · 8 of 100 slices shown]
[im 1/100]
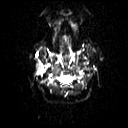
[im 20/100]
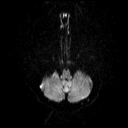
[im 30/100]
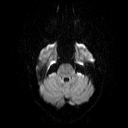
[im 40/100]
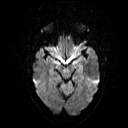
[im 60/100]
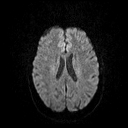
[im 70/100]
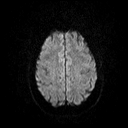
[im 80/100]
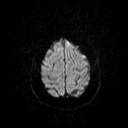
[im 100/100]
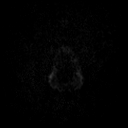

[Series 4: ep2d_diff_(id)_trace_adc · axial · 3.0mm · 1.80mm/px · z∈[-86,+61]mm · 5 of 50 slices shown]
[im 1/50]
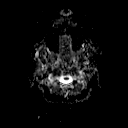
[im 13/50]
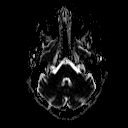
[im 25/50]
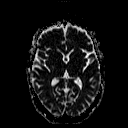
[im 37/50]
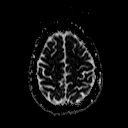
[im 50/50]
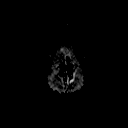

[Series 6: swi_images · axial · 2.0mm · 0.90mm/px · z∈[-83,+59]mm · 8 of 72 slices shown]
[im 1/72]
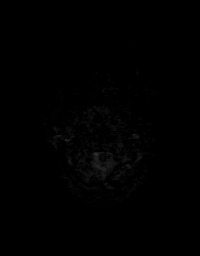
[im 11/72]
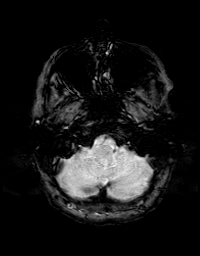
[im 21/72]
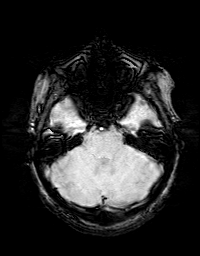
[im 31/72]
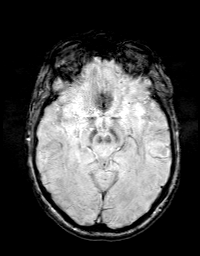
[im 41/72]
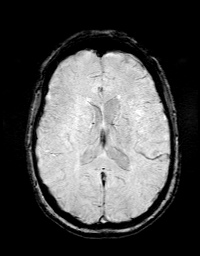
[im 51/72]
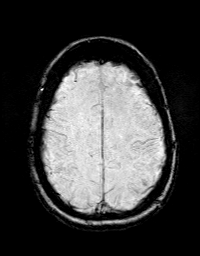
[im 61/72]
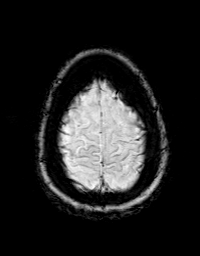
[im 72/72]
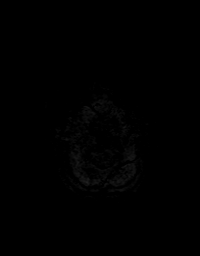

[Series 7: FLAIR · axial · 3.0mm · 0.43mm/px · z∈[-85,+59]mm · 3 of 25 slices shown]
[im 1/25]
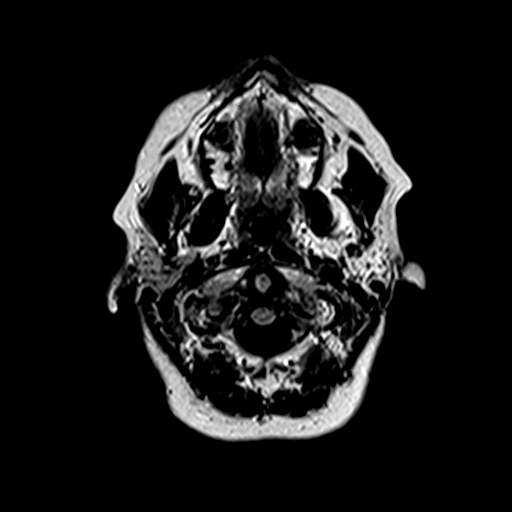
[im 13/25]
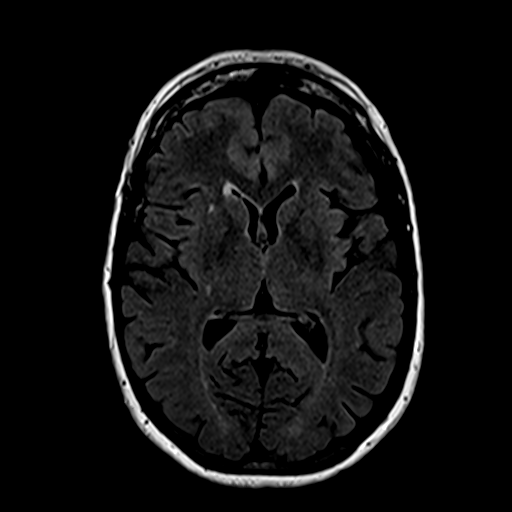
[im 25/25]
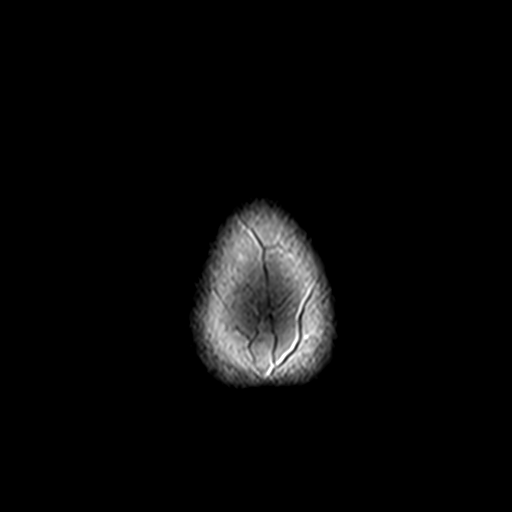

[Series 8: T2 · axial · 5.0mm · 0.45mm/px · z∈[-82,+62]mm · 3 of 24 slices shown]
[im 1/24]
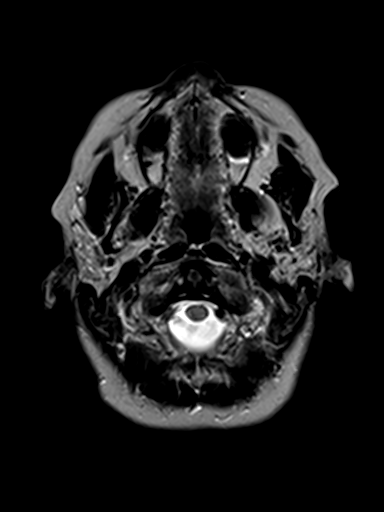
[im 12/24]
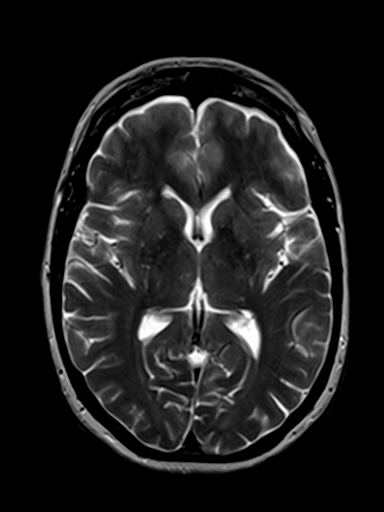
[im 24/24]
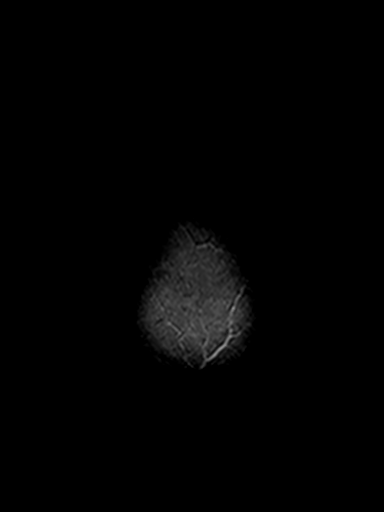

[Series 9: T1 · coronal · 3.0mm · 0.31mm/px · 1 of 11 slices shown (2 of 4)]
[im 1/11]
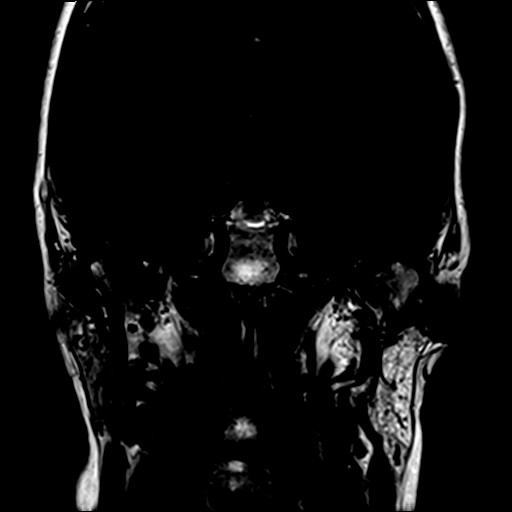

[Series 10: T1 · axial · 3.0mm · 0.31mm/px · 1 of 11 slices shown (3 of 4)]
[im 1/11]
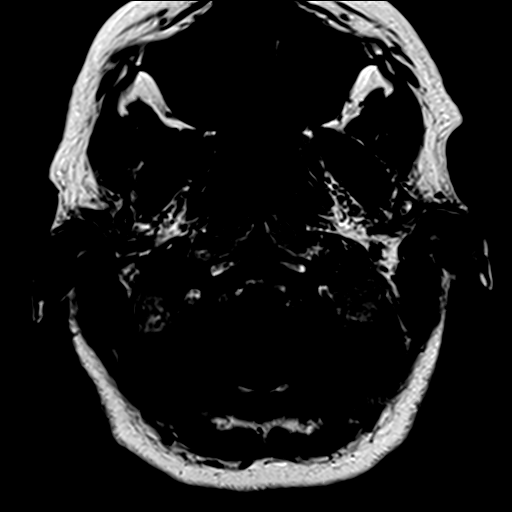

[Series 11: bSSFP · axial · 0.7mm · 0.28mm/px · z∈[-71,-41]mm · 5 of 44 slices shown]
[im 1/44]
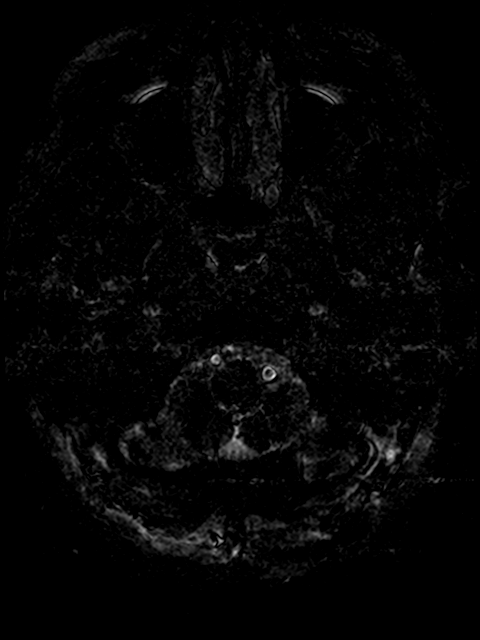
[im 11/44]
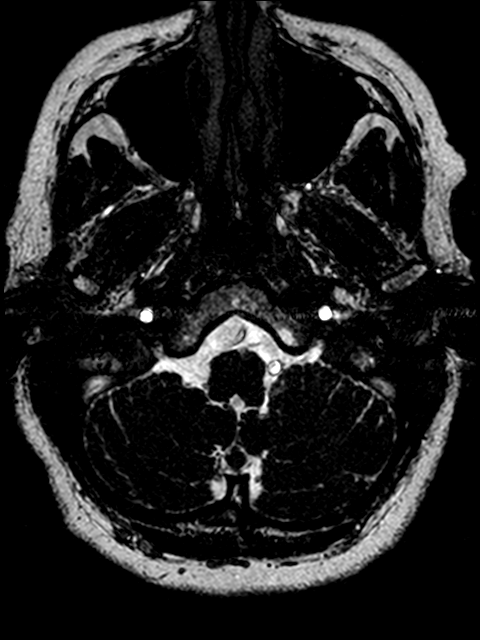
[im 22/44]
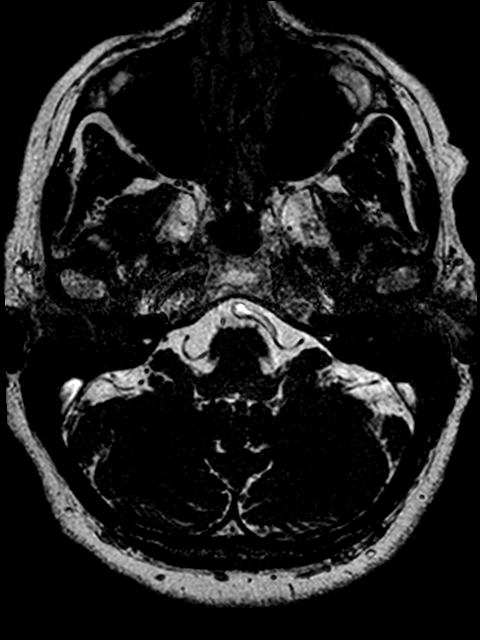
[im 33/44]
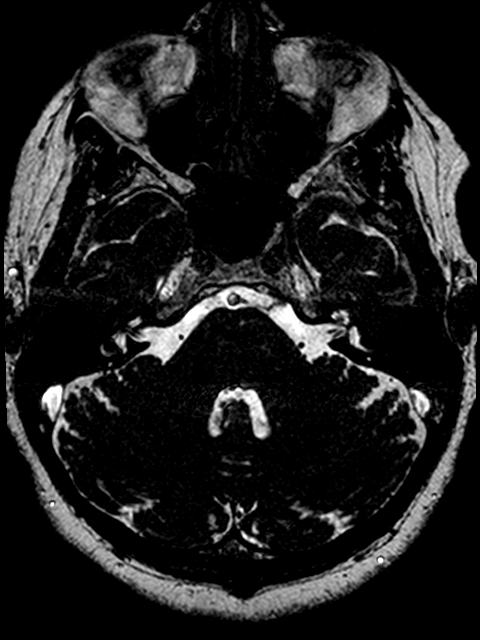
[im 44/44]
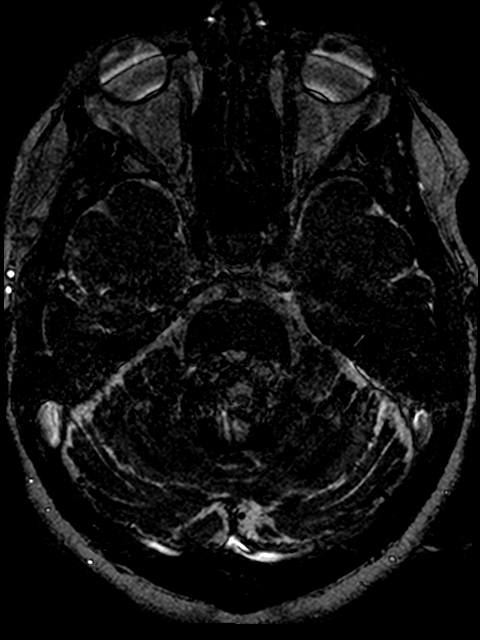

[Series 12: T1 · coronal · 3.0mm · 0.31mm/px · 1 of 11 slices shown (4 of 4)]
[im 1/11]
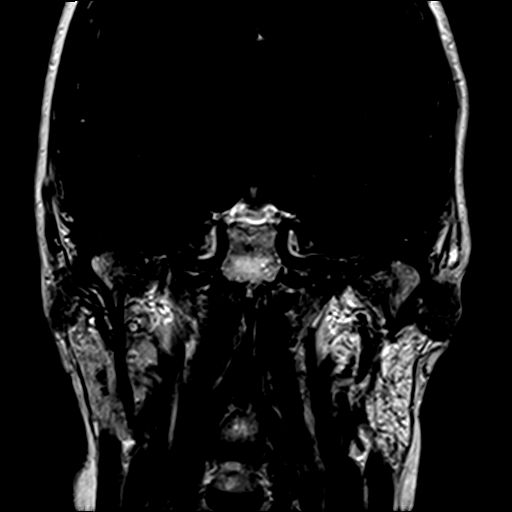

[Series 13: T1 post-contrast · axial · 3.0mm · 0.31mm/px · 1 of 11 slices shown]
[im 1/11]
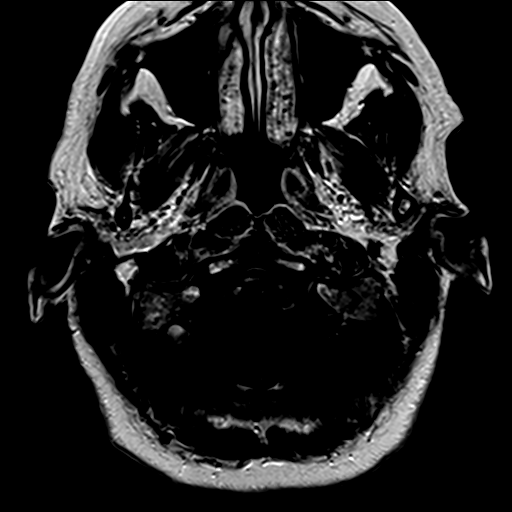

[37 of 48 positions shown; findings below may reference images not displayed]

FINDINGS: Brain: IAC protocol was performed including thin section imaging
through the posterior fossa before and after intravenous contrast.
Seventh and eighth cranial nerves normal. Negative for vestibular
schwannoma. Brainstem and cerebellum normal. Basilar cisterns
normal. Mastoid sinus clear bilaterally. No enhancing mass in the
temporal bone or posterior fossa.

Ventricle size normal. Negative for acute infarct. Scattered small
white matter hyperintensities consistent with chronic microvascular
ischemia. Chronic microhemorrhage left frontal white matter. No
other areas of hemorrhage. No mass lesion

Vascular: Normal arterial flow void

Skull and upper cervical spine: Negative

Sinuses/Orbits: Negative

Other: None
IMPRESSION: No cause for tinnitus or hearing loss identified

Mild chronic microvascular ischemia. Chronic microhemorrhage left
frontal lobe likely due to hypertension.

## 2020-03-25 DIAGNOSIS — E611 Iron deficiency: Secondary | ICD-10-CM | POA: Diagnosis not present

## 2020-03-25 DIAGNOSIS — M810 Age-related osteoporosis without current pathological fracture: Secondary | ICD-10-CM | POA: Diagnosis not present

## 2020-03-25 DIAGNOSIS — I10 Essential (primary) hypertension: Secondary | ICD-10-CM | POA: Diagnosis not present

## 2020-03-25 DIAGNOSIS — G4700 Insomnia, unspecified: Secondary | ICD-10-CM | POA: Diagnosis not present

## 2020-03-25 DIAGNOSIS — E7800 Pure hypercholesterolemia, unspecified: Secondary | ICD-10-CM | POA: Diagnosis not present

## 2020-03-25 DIAGNOSIS — E039 Hypothyroidism, unspecified: Secondary | ICD-10-CM | POA: Diagnosis not present

## 2020-03-25 DIAGNOSIS — Z0000 Encounter for general adult medical examination without abnormal findings: Secondary | ICD-10-CM | POA: Diagnosis not present

## 2020-04-21 DIAGNOSIS — M47812 Spondylosis without myelopathy or radiculopathy, cervical region: Secondary | ICD-10-CM | POA: Diagnosis not present

## 2020-05-03 DIAGNOSIS — H5203 Hypermetropia, bilateral: Secondary | ICD-10-CM | POA: Diagnosis not present

## 2020-05-03 DIAGNOSIS — H52223 Regular astigmatism, bilateral: Secondary | ICD-10-CM | POA: Diagnosis not present

## 2020-05-03 DIAGNOSIS — H40013 Open angle with borderline findings, low risk, bilateral: Secondary | ICD-10-CM | POA: Diagnosis not present

## 2020-05-03 DIAGNOSIS — H524 Presbyopia: Secondary | ICD-10-CM | POA: Diagnosis not present

## 2020-06-09 DIAGNOSIS — M542 Cervicalgia: Secondary | ICD-10-CM | POA: Diagnosis not present

## 2020-06-09 DIAGNOSIS — M47812 Spondylosis without myelopathy or radiculopathy, cervical region: Secondary | ICD-10-CM | POA: Diagnosis not present

## 2020-06-17 DIAGNOSIS — E890 Postprocedural hypothyroidism: Secondary | ICD-10-CM | POA: Diagnosis not present

## 2020-06-17 DIAGNOSIS — M8580 Other specified disorders of bone density and structure, unspecified site: Secondary | ICD-10-CM | POA: Diagnosis not present

## 2020-06-17 DIAGNOSIS — M8589 Other specified disorders of bone density and structure, multiple sites: Secondary | ICD-10-CM | POA: Diagnosis not present

## 2020-06-17 DIAGNOSIS — E559 Vitamin D deficiency, unspecified: Secondary | ICD-10-CM | POA: Diagnosis not present

## 2020-06-21 DIAGNOSIS — E559 Vitamin D deficiency, unspecified: Secondary | ICD-10-CM | POA: Diagnosis not present

## 2020-06-21 DIAGNOSIS — E890 Postprocedural hypothyroidism: Secondary | ICD-10-CM | POA: Diagnosis not present

## 2020-06-21 DIAGNOSIS — M8580 Other specified disorders of bone density and structure, unspecified site: Secondary | ICD-10-CM | POA: Diagnosis not present

## 2020-06-21 DIAGNOSIS — I10 Essential (primary) hypertension: Secondary | ICD-10-CM | POA: Diagnosis not present

## 2020-06-21 DIAGNOSIS — N951 Menopausal and female climacteric states: Secondary | ICD-10-CM | POA: Diagnosis not present

## 2020-06-28 DIAGNOSIS — M5412 Radiculopathy, cervical region: Secondary | ICD-10-CM | POA: Diagnosis not present

## 2020-08-26 DIAGNOSIS — L821 Other seborrheic keratosis: Secondary | ICD-10-CM | POA: Diagnosis not present

## 2020-08-26 DIAGNOSIS — Z85828 Personal history of other malignant neoplasm of skin: Secondary | ICD-10-CM | POA: Diagnosis not present

## 2020-08-26 DIAGNOSIS — Z85820 Personal history of malignant melanoma of skin: Secondary | ICD-10-CM | POA: Diagnosis not present

## 2020-08-26 DIAGNOSIS — L814 Other melanin hyperpigmentation: Secondary | ICD-10-CM | POA: Diagnosis not present

## 2020-08-26 DIAGNOSIS — L905 Scar conditions and fibrosis of skin: Secondary | ICD-10-CM | POA: Diagnosis not present

## 2020-08-26 DIAGNOSIS — D225 Melanocytic nevi of trunk: Secondary | ICD-10-CM | POA: Diagnosis not present

## 2020-10-14 DIAGNOSIS — I10 Essential (primary) hypertension: Secondary | ICD-10-CM | POA: Diagnosis not present

## 2020-10-14 DIAGNOSIS — R69 Illness, unspecified: Secondary | ICD-10-CM | POA: Diagnosis not present

## 2020-10-14 DIAGNOSIS — Z23 Encounter for immunization: Secondary | ICD-10-CM | POA: Diagnosis not present

## 2020-10-27 DIAGNOSIS — H40051 Ocular hypertension, right eye: Secondary | ICD-10-CM | POA: Diagnosis not present

## 2020-10-27 DIAGNOSIS — H25813 Combined forms of age-related cataract, bilateral: Secondary | ICD-10-CM | POA: Diagnosis not present

## 2020-10-27 DIAGNOSIS — H40052 Ocular hypertension, left eye: Secondary | ICD-10-CM | POA: Diagnosis not present

## 2020-10-27 DIAGNOSIS — H5203 Hypermetropia, bilateral: Secondary | ICD-10-CM | POA: Diagnosis not present

## 2020-10-27 DIAGNOSIS — H40033 Anatomical narrow angle, bilateral: Secondary | ICD-10-CM | POA: Diagnosis not present

## 2020-10-27 DIAGNOSIS — H52223 Regular astigmatism, bilateral: Secondary | ICD-10-CM | POA: Diagnosis not present

## 2020-10-27 DIAGNOSIS — H524 Presbyopia: Secondary | ICD-10-CM | POA: Diagnosis not present

## 2020-11-10 DIAGNOSIS — Z01419 Encounter for gynecological examination (general) (routine) without abnormal findings: Secondary | ICD-10-CM | POA: Diagnosis not present

## 2020-11-14 DIAGNOSIS — Z1231 Encounter for screening mammogram for malignant neoplasm of breast: Secondary | ICD-10-CM | POA: Diagnosis not present

## 2021-03-01 DIAGNOSIS — M9903 Segmental and somatic dysfunction of lumbar region: Secondary | ICD-10-CM | POA: Diagnosis not present

## 2021-03-01 DIAGNOSIS — M5412 Radiculopathy, cervical region: Secondary | ICD-10-CM | POA: Diagnosis not present

## 2021-03-01 DIAGNOSIS — M9901 Segmental and somatic dysfunction of cervical region: Secondary | ICD-10-CM | POA: Diagnosis not present

## 2021-03-01 DIAGNOSIS — M542 Cervicalgia: Secondary | ICD-10-CM | POA: Diagnosis not present

## 2021-03-02 DIAGNOSIS — M542 Cervicalgia: Secondary | ICD-10-CM | POA: Diagnosis not present

## 2021-03-02 DIAGNOSIS — M9901 Segmental and somatic dysfunction of cervical region: Secondary | ICD-10-CM | POA: Diagnosis not present

## 2021-03-02 DIAGNOSIS — M5412 Radiculopathy, cervical region: Secondary | ICD-10-CM | POA: Diagnosis not present

## 2021-03-02 DIAGNOSIS — M9903 Segmental and somatic dysfunction of lumbar region: Secondary | ICD-10-CM | POA: Diagnosis not present

## 2021-03-03 DIAGNOSIS — M9901 Segmental and somatic dysfunction of cervical region: Secondary | ICD-10-CM | POA: Diagnosis not present

## 2021-03-03 DIAGNOSIS — M542 Cervicalgia: Secondary | ICD-10-CM | POA: Diagnosis not present

## 2021-03-03 DIAGNOSIS — M9903 Segmental and somatic dysfunction of lumbar region: Secondary | ICD-10-CM | POA: Diagnosis not present

## 2021-03-03 DIAGNOSIS — M5412 Radiculopathy, cervical region: Secondary | ICD-10-CM | POA: Diagnosis not present

## 2021-03-06 DIAGNOSIS — M9901 Segmental and somatic dysfunction of cervical region: Secondary | ICD-10-CM | POA: Diagnosis not present

## 2021-03-06 DIAGNOSIS — M5412 Radiculopathy, cervical region: Secondary | ICD-10-CM | POA: Diagnosis not present

## 2021-03-06 DIAGNOSIS — M9903 Segmental and somatic dysfunction of lumbar region: Secondary | ICD-10-CM | POA: Diagnosis not present

## 2021-03-06 DIAGNOSIS — M542 Cervicalgia: Secondary | ICD-10-CM | POA: Diagnosis not present

## 2021-03-07 DIAGNOSIS — M542 Cervicalgia: Secondary | ICD-10-CM | POA: Diagnosis not present

## 2021-03-07 DIAGNOSIS — M9903 Segmental and somatic dysfunction of lumbar region: Secondary | ICD-10-CM | POA: Diagnosis not present

## 2021-03-07 DIAGNOSIS — M9901 Segmental and somatic dysfunction of cervical region: Secondary | ICD-10-CM | POA: Diagnosis not present

## 2021-03-07 DIAGNOSIS — M5412 Radiculopathy, cervical region: Secondary | ICD-10-CM | POA: Diagnosis not present

## 2021-03-10 DIAGNOSIS — M542 Cervicalgia: Secondary | ICD-10-CM | POA: Diagnosis not present

## 2021-03-10 DIAGNOSIS — M5412 Radiculopathy, cervical region: Secondary | ICD-10-CM | POA: Diagnosis not present

## 2021-03-10 DIAGNOSIS — M9901 Segmental and somatic dysfunction of cervical region: Secondary | ICD-10-CM | POA: Diagnosis not present

## 2021-03-10 DIAGNOSIS — M9903 Segmental and somatic dysfunction of lumbar region: Secondary | ICD-10-CM | POA: Diagnosis not present

## 2021-03-13 DIAGNOSIS — M542 Cervicalgia: Secondary | ICD-10-CM | POA: Diagnosis not present

## 2021-03-13 DIAGNOSIS — M5412 Radiculopathy, cervical region: Secondary | ICD-10-CM | POA: Diagnosis not present

## 2021-03-13 DIAGNOSIS — M9903 Segmental and somatic dysfunction of lumbar region: Secondary | ICD-10-CM | POA: Diagnosis not present

## 2021-03-13 DIAGNOSIS — M9901 Segmental and somatic dysfunction of cervical region: Secondary | ICD-10-CM | POA: Diagnosis not present

## 2021-03-15 DIAGNOSIS — M5412 Radiculopathy, cervical region: Secondary | ICD-10-CM | POA: Diagnosis not present

## 2021-03-15 DIAGNOSIS — M9901 Segmental and somatic dysfunction of cervical region: Secondary | ICD-10-CM | POA: Diagnosis not present

## 2021-03-15 DIAGNOSIS — M9903 Segmental and somatic dysfunction of lumbar region: Secondary | ICD-10-CM | POA: Diagnosis not present

## 2021-03-15 DIAGNOSIS — M542 Cervicalgia: Secondary | ICD-10-CM | POA: Diagnosis not present

## 2021-03-17 DIAGNOSIS — M9903 Segmental and somatic dysfunction of lumbar region: Secondary | ICD-10-CM | POA: Diagnosis not present

## 2021-03-17 DIAGNOSIS — M5412 Radiculopathy, cervical region: Secondary | ICD-10-CM | POA: Diagnosis not present

## 2021-03-17 DIAGNOSIS — M542 Cervicalgia: Secondary | ICD-10-CM | POA: Diagnosis not present

## 2021-03-17 DIAGNOSIS — M9901 Segmental and somatic dysfunction of cervical region: Secondary | ICD-10-CM | POA: Diagnosis not present

## 2021-03-20 DIAGNOSIS — M9901 Segmental and somatic dysfunction of cervical region: Secondary | ICD-10-CM | POA: Diagnosis not present

## 2021-03-20 DIAGNOSIS — M5412 Radiculopathy, cervical region: Secondary | ICD-10-CM | POA: Diagnosis not present

## 2021-03-20 DIAGNOSIS — M542 Cervicalgia: Secondary | ICD-10-CM | POA: Diagnosis not present

## 2021-03-20 DIAGNOSIS — M9903 Segmental and somatic dysfunction of lumbar region: Secondary | ICD-10-CM | POA: Diagnosis not present

## 2021-03-21 DIAGNOSIS — M5412 Radiculopathy, cervical region: Secondary | ICD-10-CM | POA: Diagnosis not present

## 2021-03-21 DIAGNOSIS — M542 Cervicalgia: Secondary | ICD-10-CM | POA: Diagnosis not present

## 2021-03-21 DIAGNOSIS — M9903 Segmental and somatic dysfunction of lumbar region: Secondary | ICD-10-CM | POA: Diagnosis not present

## 2021-03-21 DIAGNOSIS — M9901 Segmental and somatic dysfunction of cervical region: Secondary | ICD-10-CM | POA: Diagnosis not present

## 2021-03-24 DIAGNOSIS — I10 Essential (primary) hypertension: Secondary | ICD-10-CM | POA: Diagnosis not present

## 2021-03-24 DIAGNOSIS — M542 Cervicalgia: Secondary | ICD-10-CM | POA: Diagnosis not present

## 2021-03-24 DIAGNOSIS — E7800 Pure hypercholesterolemia, unspecified: Secondary | ICD-10-CM | POA: Diagnosis not present

## 2021-03-24 DIAGNOSIS — E611 Iron deficiency: Secondary | ICD-10-CM | POA: Diagnosis not present

## 2021-03-24 DIAGNOSIS — M5412 Radiculopathy, cervical region: Secondary | ICD-10-CM | POA: Diagnosis not present

## 2021-03-24 DIAGNOSIS — M9903 Segmental and somatic dysfunction of lumbar region: Secondary | ICD-10-CM | POA: Diagnosis not present

## 2021-03-24 DIAGNOSIS — M9901 Segmental and somatic dysfunction of cervical region: Secondary | ICD-10-CM | POA: Diagnosis not present

## 2021-03-28 DIAGNOSIS — E7800 Pure hypercholesterolemia, unspecified: Secondary | ICD-10-CM | POA: Diagnosis not present

## 2021-03-28 DIAGNOSIS — Z0000 Encounter for general adult medical examination without abnormal findings: Secondary | ICD-10-CM | POA: Diagnosis not present

## 2021-03-28 DIAGNOSIS — I10 Essential (primary) hypertension: Secondary | ICD-10-CM | POA: Diagnosis not present

## 2021-03-28 DIAGNOSIS — M810 Age-related osteoporosis without current pathological fracture: Secondary | ICD-10-CM | POA: Diagnosis not present

## 2021-03-28 DIAGNOSIS — E039 Hypothyroidism, unspecified: Secondary | ICD-10-CM | POA: Diagnosis not present

## 2021-03-28 DIAGNOSIS — E611 Iron deficiency: Secondary | ICD-10-CM | POA: Diagnosis not present

## 2021-03-28 DIAGNOSIS — G4700 Insomnia, unspecified: Secondary | ICD-10-CM | POA: Diagnosis not present

## 2021-03-29 DIAGNOSIS — M9901 Segmental and somatic dysfunction of cervical region: Secondary | ICD-10-CM | POA: Diagnosis not present

## 2021-03-29 DIAGNOSIS — M5412 Radiculopathy, cervical region: Secondary | ICD-10-CM | POA: Diagnosis not present

## 2021-03-29 DIAGNOSIS — M9903 Segmental and somatic dysfunction of lumbar region: Secondary | ICD-10-CM | POA: Diagnosis not present

## 2021-03-29 DIAGNOSIS — M542 Cervicalgia: Secondary | ICD-10-CM | POA: Diagnosis not present

## 2021-03-31 DIAGNOSIS — M5412 Radiculopathy, cervical region: Secondary | ICD-10-CM | POA: Diagnosis not present

## 2021-03-31 DIAGNOSIS — M542 Cervicalgia: Secondary | ICD-10-CM | POA: Diagnosis not present

## 2021-03-31 DIAGNOSIS — M9901 Segmental and somatic dysfunction of cervical region: Secondary | ICD-10-CM | POA: Diagnosis not present

## 2021-03-31 DIAGNOSIS — M9903 Segmental and somatic dysfunction of lumbar region: Secondary | ICD-10-CM | POA: Diagnosis not present

## 2021-04-04 DIAGNOSIS — M542 Cervicalgia: Secondary | ICD-10-CM | POA: Diagnosis not present

## 2021-04-04 DIAGNOSIS — M5412 Radiculopathy, cervical region: Secondary | ICD-10-CM | POA: Diagnosis not present

## 2021-04-04 DIAGNOSIS — M9903 Segmental and somatic dysfunction of lumbar region: Secondary | ICD-10-CM | POA: Diagnosis not present

## 2021-04-04 DIAGNOSIS — M9901 Segmental and somatic dysfunction of cervical region: Secondary | ICD-10-CM | POA: Diagnosis not present

## 2021-06-14 DIAGNOSIS — E890 Postprocedural hypothyroidism: Secondary | ICD-10-CM | POA: Diagnosis not present

## 2021-06-14 DIAGNOSIS — M8580 Other specified disorders of bone density and structure, unspecified site: Secondary | ICD-10-CM | POA: Diagnosis not present

## 2021-06-21 DIAGNOSIS — N951 Menopausal and female climacteric states: Secondary | ICD-10-CM | POA: Diagnosis not present

## 2021-06-21 DIAGNOSIS — E559 Vitamin D deficiency, unspecified: Secondary | ICD-10-CM | POA: Diagnosis not present

## 2021-06-21 DIAGNOSIS — M8580 Other specified disorders of bone density and structure, unspecified site: Secondary | ICD-10-CM | POA: Diagnosis not present

## 2021-06-21 DIAGNOSIS — I10 Essential (primary) hypertension: Secondary | ICD-10-CM | POA: Diagnosis not present

## 2021-06-21 DIAGNOSIS — E890 Postprocedural hypothyroidism: Secondary | ICD-10-CM | POA: Diagnosis not present

## 2021-06-22 DIAGNOSIS — M79671 Pain in right foot: Secondary | ICD-10-CM | POA: Diagnosis not present

## 2021-06-22 DIAGNOSIS — F411 Generalized anxiety disorder: Secondary | ICD-10-CM | POA: Diagnosis not present

## 2021-06-22 DIAGNOSIS — R69 Illness, unspecified: Secondary | ICD-10-CM | POA: Diagnosis not present

## 2021-06-22 DIAGNOSIS — M79672 Pain in left foot: Secondary | ICD-10-CM | POA: Diagnosis not present

## 2021-06-27 DIAGNOSIS — Z86010 Personal history of colonic polyps: Secondary | ICD-10-CM | POA: Diagnosis not present

## 2021-06-27 DIAGNOSIS — Z09 Encounter for follow-up examination after completed treatment for conditions other than malignant neoplasm: Secondary | ICD-10-CM | POA: Diagnosis not present

## 2021-06-27 DIAGNOSIS — D124 Benign neoplasm of descending colon: Secondary | ICD-10-CM | POA: Diagnosis not present

## 2021-06-27 DIAGNOSIS — K648 Other hemorrhoids: Secondary | ICD-10-CM | POA: Diagnosis not present

## 2021-06-27 DIAGNOSIS — Z800 Family history of malignant neoplasm of digestive organs: Secondary | ICD-10-CM | POA: Diagnosis not present

## 2021-06-27 DIAGNOSIS — K5730 Diverticulosis of large intestine without perforation or abscess without bleeding: Secondary | ICD-10-CM | POA: Diagnosis not present

## 2021-06-29 DIAGNOSIS — D124 Benign neoplasm of descending colon: Secondary | ICD-10-CM | POA: Diagnosis not present

## 2021-07-13 ENCOUNTER — Ambulatory Visit (INDEPENDENT_AMBULATORY_CARE_PROVIDER_SITE_OTHER): Payer: Medicare HMO | Admitting: Podiatry

## 2021-07-13 ENCOUNTER — Ambulatory Visit (INDEPENDENT_AMBULATORY_CARE_PROVIDER_SITE_OTHER): Payer: Medicare HMO

## 2021-07-13 DIAGNOSIS — M722 Plantar fascial fibromatosis: Secondary | ICD-10-CM | POA: Diagnosis not present

## 2021-07-13 MED ORDER — TRIAMCINOLONE ACETONIDE 10 MG/ML IJ SUSP
20.0000 mg | Freq: Once | INTRAMUSCULAR | Status: AC
  Administered 2021-07-13: 20 mg

## 2021-07-13 NOTE — Progress Notes (Signed)
Subjective:   Patient ID: Erin Parks, female   DOB: 71 y.o.   MRN: [id number]   HPI Patient presents stating she has developed significant pain in the heel region left over right that is been present now for several months.  It all started at the same time with shoe gear usage and she is not sure of the actual etiology.  Patient states they are both quite sore the right 1 not to the same degree and patient does not smoke likes to be active   Review of Systems  All other systems reviewed and are negative.       Objective:  Physical Exam Vitals and nursing note reviewed.  Constitutional:      Appearance: She is well-developed.  Pulmonary:     Effort: Pulmonary effort is normal.  Musculoskeletal:        General: Normal range of motion.  Skin:    General: Skin is warm.  Neurological:     Mental Status: She is alert.     Neurovascular status intact muscle strength adequate range of motion within normal limits with patient noted to have quite a bit of discomfort in the plantar fascia with swelling of the medial band of insertional point of the tendon into the calcaneus with moderate depression of the arch.  Patient is noted to have good digital perfusion well oriented x3 mild equinus condition noted     Assessment:  Acute plantar fasciitis left over right with inflammation fluid medial band     Plan:  H&P x-rays reviewed discussed the condition discussed structure of the arch and the pathology present.  Today I did sterile prep injected the medial band of the fascia bilateral 3 mg Kenalog 5 mg Xylocaine applied fascial brace left with instructions on usage and I instructed her on the proper fitting to hold up the arch.  Patient will be seen back to recheck 2 weeks  X-rays indicate small spur formation left over right heel no indication stress fracture arthritis

## 2021-07-13 NOTE — Patient Instructions (Signed)

## 2021-07-28 ENCOUNTER — Ambulatory Visit (INDEPENDENT_AMBULATORY_CARE_PROVIDER_SITE_OTHER): Payer: Medicare HMO | Admitting: Podiatry

## 2021-07-28 ENCOUNTER — Encounter: Payer: Self-pay | Admitting: Podiatry

## 2021-07-28 DIAGNOSIS — M722 Plantar fascial fibromatosis: Secondary | ICD-10-CM | POA: Diagnosis not present

## 2021-07-28 MED ORDER — TRIAMCINOLONE ACETONIDE 10 MG/ML IJ SUSP
10.0000 mg | Freq: Once | INTRAMUSCULAR | Status: AC
  Administered 2021-07-28: 10 mg

## 2021-07-31 NOTE — Progress Notes (Signed)
Subjective:   Patient ID: Erin Parks, female   DOB: 71 y.o.   MRN: [id number]   HPI Patient presents with continued pain in the left plantar fascia stating the right 1 is improved but the left one is still quite sore and making it hard to be active.  Patient states the pain is worse when getting up in the morning and after periods of sitting   ROS      Objective:  Physical Exam  Neurovascular status intact with exquisite discomfort plantar aspect left heel still noted with inflammation fluid buildup and tightness of the plantar arch noted upon palpation     Assessment:  Continuation of Planter fasciitis left with improvement in the right with pain worse when periods of not stretching or occurring     Plan:  Reviewed the condition sterile prep reinjected the fascia at insertion 3 mg Kenalog 5 mg Xylocaine and went ahead today dispensed a night splint for usage explaining the usage of this while sleeping and in the evening along with ice therapy with it being fitted properly to her foot and found to be in good position.  Reappoint to recheck in the next 4 weeks

## 2021-08-21 ENCOUNTER — Ambulatory Visit (INDEPENDENT_AMBULATORY_CARE_PROVIDER_SITE_OTHER): Payer: Medicare HMO | Admitting: Podiatry

## 2021-08-21 ENCOUNTER — Encounter: Payer: Self-pay | Admitting: Podiatry

## 2021-08-21 DIAGNOSIS — M722 Plantar fascial fibromatosis: Secondary | ICD-10-CM

## 2021-08-23 NOTE — Progress Notes (Signed)
Subjective:   Patient ID: Erin Parks, female   DOB: 71 y.o.   MRN: [id number]   HPI Patient states she continues to improve mild discomfort if she has been too active but overall doing well   ROS      Objective:  Physical Exam  Neurovascular status intact muscle strength found to be adequate range of motion within normal limits with patient's pain improved by about 80% mild discomfort only upon deep palpation     Assessment:  Acute fasciitis-like symptoms left that are improving with mild discomfort only upon deep palpation     Plan:  Reviewed condition recommended continuation of stretching exercises ice therapy shoe gear modifications patient's discharge will be seen back as needed

## 2021-08-31 ENCOUNTER — Telehealth: Payer: Self-pay | Admitting: *Deleted

## 2021-08-31 NOTE — Telephone Encounter (Signed)
Patient is calling to ask if she should start wearing her boot at night , brace during the day while walking.during the day. Left foot pain has become worse. Please advise.

## 2021-09-07 NOTE — Telephone Encounter (Signed)
Yes until she sees me

## 2021-09-08 NOTE — Telephone Encounter (Signed)
Called patient, no answer, left message of physician's recommendations

## 2021-09-13 ENCOUNTER — Encounter: Payer: Self-pay | Admitting: Podiatry

## 2021-09-13 ENCOUNTER — Ambulatory Visit (INDEPENDENT_AMBULATORY_CARE_PROVIDER_SITE_OTHER): Payer: Medicare HMO | Admitting: Podiatry

## 2021-09-13 DIAGNOSIS — M722 Plantar fascial fibromatosis: Secondary | ICD-10-CM | POA: Diagnosis not present

## 2021-09-13 MED ORDER — DICLOFENAC SODIUM 75 MG PO TBEC: 75.0000 mg | DELAYED_RELEASE_TABLET | Freq: Two times a day (BID) | ORAL | 2 refills | Status: DC

## 2021-09-13 MED ORDER — TRIAMCINOLONE ACETONIDE 10 MG/ML IJ SUSP
10.0000 mg | Freq: Once | INTRAMUSCULAR | Status: AC
  Administered 2021-09-13: 10 mg

## 2021-09-13 NOTE — Progress Notes (Signed)
Subjective:   Patient ID: Erin Parks, female   DOB: 71 y.o.   MRN: [id number]   HPI Patient states her heel is hurting her a lot and it got better for a period of time and then reoccurred with a vengeance and is as bad as it was when we started   ROS      Objective:  Physical Exam  Acute plantar fasciitis left with fluid buildup swelling around the medial and central band of the fascia involving the entire heel itself     Assessment:  Acute plantar fasciitis left very painful when pressed at the insertion     Plan:  H&P reviewed at great length and I went ahead today and I did sterile prep injected the medial and central band of the fascia 3 mg Kenalog 5 mg Xylocaine and applied a air fracture walker to completely immobilize take all pressure off the bottom of the heel and allow it to rest.  Reappoint to recheck may require PRP shockwave for surgery ultimately if it does not improve

## 2021-10-11 ENCOUNTER — Telehealth: Payer: Self-pay | Admitting: Urology

## 2021-10-11 ENCOUNTER — Ambulatory Visit (INDEPENDENT_AMBULATORY_CARE_PROVIDER_SITE_OTHER): Payer: Medicare HMO | Admitting: Podiatry

## 2021-10-11 ENCOUNTER — Encounter: Payer: Self-pay | Admitting: Podiatry

## 2021-10-11 DIAGNOSIS — M722 Plantar fascial fibromatosis: Secondary | ICD-10-CM | POA: Diagnosis not present

## 2021-10-11 NOTE — Telephone Encounter (Signed)
DOS - 10/24/21  EPF LEFT --- 95369  [employer] EFFECTIVE DATE - 01/02/20  SPOKE WITH TOM WITH [employer] AND HE STATED THAT FOR CPT CODE 22300 NO PRIOR AUTH IS REQUIRED.   REF # [id number]  [employer] EFFECTIVE DATE - 01/01/21  PLAN DEDUCTIBLE - $1,500.00 W/ $1,340.85 REMAINING OUT OF POCKET - $5,900.00 W/ $8,209.90 REMAINING COINSURANCE - 30% COPAY - $0.00  NO PRIOR AUTH IS REQUIRED.

## 2021-10-11 NOTE — Progress Notes (Signed)
Subjective:   Patient ID: Erin Parks, female   DOB: 71 y.o.   MRN: [id number]   HPI Patient has severe pain still in the bottom of the left heel that is only a little bit relieved with boot but is still severely sore and sore all other times with patient at this point in tears due to the pain   ROS      Objective:  Physical Exam  Neurovascular status intact with patient found to have exquisite discomfort medial fascial band left at the insertional point tendon calcaneus inflammation fluid around the medial band of the tendon     Assessment:  Acute Planter fasciitis left inflammation fluid in the medial band     Plan:  H&P reviewed condition and discussed the severity of symptoms and failure to respond conservatively and options at this point.  At this time surgical interventions been recommended patient wants to have this done and I allowed her to read consent form going over all alternative treatments complications she is willing to accept risk wants surgery signed consent form scheduled for outpatient surgery with all questions answered.  Patient understands that recovery can take several months and that she will wear the boot for approximate 4 weeks after the procedure

## 2021-10-16 ENCOUNTER — Telehealth: Payer: Self-pay | Admitting: *Deleted

## 2021-10-16 NOTE — Telephone Encounter (Signed)
Patient is calling to ask for pain meds to be sent to pharmacy a day prior to surgery, a referral for crutches post op.

## 2021-10-17 NOTE — Telephone Encounter (Signed)
Patient is asking for an order for crutches after surgery Pain medicines to be faxed to pharmacy prior to her surgery on 10/24/21.

## 2021-10-18 ENCOUNTER — Telehealth: Payer: Self-pay | Admitting: Podiatry

## 2021-10-18 NOTE — Telephone Encounter (Signed)
Called and spoke to [place] with [place]. She informed me their system is down to check to see if prior authorization is needed. However, she had me go to the website and along with me checked the CPT code [phone] and prior authorization is Not required. Verified via code list for [employer] [place] and [place].

## 2021-10-18 NOTE — Telephone Encounter (Signed)
Patient is aware and verbalized understanding of the information given. 

## 2021-10-20 ENCOUNTER — Telehealth: Payer: Self-pay | Admitting: *Deleted

## 2021-10-20 ENCOUNTER — Other Ambulatory Visit: Payer: Self-pay | Admitting: Podiatry

## 2021-10-20 MED ORDER — HYDROCODONE-ACETAMINOPHEN 10-325 MG PO TABS: 1.0000 | ORAL_TABLET | Freq: Three times a day (TID) | ORAL | 0 refills | Status: AC | PRN

## 2021-10-20 NOTE — Telephone Encounter (Signed)
Patient is calling for pain medicine status that was supposed to be sent to her pharmacy on file, not there, having surgery on Tuesday,please advise.

## 2021-10-20 NOTE — Telephone Encounter (Signed)
Sent it to the pharmacy

## 2021-10-23 NOTE — Telephone Encounter (Signed)
Patient lvm on Friday after hours and stated that she told someone in the office that she is allergic to Codeine. She was prescibed Hydrocodone for her pain meds and would liked it change to something else.  Please advise

## 2021-10-23 NOTE — Telephone Encounter (Signed)
Spoke to the patient and she stated that she will be ok due to she has not taken Codeine medications since college. She also spoke to the pharmacist as well and they said she should be fine. Patient has already picked up the Hydrocodone medication from the pharmacy

## 2021-10-23 NOTE — Telephone Encounter (Signed)
What is she able to tolerate? Please call her and let me know

## 2021-10-24 ENCOUNTER — Telehealth: Payer: Self-pay | Admitting: *Deleted

## 2021-10-24 ENCOUNTER — Encounter: Payer: Self-pay | Admitting: Podiatry

## 2021-10-24 DIAGNOSIS — M722 Plantar fascial fibromatosis: Secondary | ICD-10-CM

## 2021-10-24 NOTE — Telephone Encounter (Addendum)
Patient's daughter is wanting to know if she can propt her foot up in bed on a pillow in bed,explained to patient to follow the instructions given at surgical center for restinfg and elevation ,if feeling discomfort may loosen ace wrap w/o disturbing other wrappings,may place ice pk behind the knee to help with swelling.

## 2021-10-30 ENCOUNTER — Ambulatory Visit (INDEPENDENT_AMBULATORY_CARE_PROVIDER_SITE_OTHER): Payer: Medicare HMO | Admitting: Podiatry

## 2021-10-30 DIAGNOSIS — M722 Plantar fascial fibromatosis: Secondary | ICD-10-CM | POA: Diagnosis not present

## 2021-10-30 NOTE — Progress Notes (Signed)
POV #1 DOS 10/24/2021 EPF LT  Patient denies nausea, vomiting, fever and chills.  Patient states OTC pain medication is helpful with pain control at this time.    Compression dressing applied at this time. Patient will return to office in 2 weeks for suture removal. Advised patient that she can gradually start to walk in the boot and slowly work into a supportive athletic type of shoe. Advised patient to continue to ice and elevate the foot and wear night splint at night.   Advised patient to call the office with any signs or symptoms of infection. Patient verbalized understanding.

## 2021-11-02 ENCOUNTER — Telehealth: Payer: Self-pay

## 2021-11-02 DIAGNOSIS — H5203 Hypermetropia, bilateral: Secondary | ICD-10-CM | POA: Diagnosis not present

## 2021-11-02 NOTE — Telephone Encounter (Signed)
Should ease up. She can take ibuprophen or tyenol. She can try sleeping without any device

## 2021-11-02 NOTE — Telephone Encounter (Signed)
Attempted to contact them patient no answer and no voicemail set up.

## 2021-11-03 NOTE — Telephone Encounter (Signed)
Patient calling back and given instructions per physician.

## 2021-11-15 ENCOUNTER — Ambulatory Visit (INDEPENDENT_AMBULATORY_CARE_PROVIDER_SITE_OTHER): Payer: Medicare HMO | Admitting: Podiatry

## 2021-11-15 DIAGNOSIS — M722 Plantar fascial fibromatosis: Secondary | ICD-10-CM | POA: Diagnosis not present

## 2021-11-15 NOTE — Progress Notes (Signed)
POV #2 DOS 10/24/2021 EPF LT  Patient denies any nausea, vomiting, fever, and chills.   Lidocaine gel was applied to the top of sutures. Sutures were removed today without any complications.   Anklet was provided today to patient to continue to use. Advise patient that she can gradually start to walk in the boot and slowly work into supportive athletic type of shoe. Advised patient to continue to ice and stretch.   Advised patient to call the office with any signs or symptoms of infection. Patient verbalized understanding.

## 2021-11-15 NOTE — Patient Instructions (Signed)

## 2021-11-20 DIAGNOSIS — N951 Menopausal and female climacteric states: Secondary | ICD-10-CM | POA: Diagnosis not present

## 2021-11-20 DIAGNOSIS — Z1231 Encounter for screening mammogram for malignant neoplasm of breast: Secondary | ICD-10-CM | POA: Diagnosis not present

## 2021-11-20 DIAGNOSIS — Z01419 Encounter for gynecological examination (general) (routine) without abnormal findings: Secondary | ICD-10-CM | POA: Diagnosis not present

## 2021-12-11 ENCOUNTER — Ambulatory Visit (INDEPENDENT_AMBULATORY_CARE_PROVIDER_SITE_OTHER): Payer: Medicare HMO

## 2021-12-11 ENCOUNTER — Ambulatory Visit (INDEPENDENT_AMBULATORY_CARE_PROVIDER_SITE_OTHER): Payer: Medicare HMO | Admitting: Podiatry

## 2021-12-11 DIAGNOSIS — Z9889 Other specified postprocedural states: Secondary | ICD-10-CM

## 2021-12-11 NOTE — Progress Notes (Signed)
Subjective:   Patient ID: Erin Parks, female   DOB: 71 y.o.   MRN: [id number]   HPI Patient states she is still having some pain in her heel states that it is better than it was she is not getting the shooting pain she did prior to surgery but her arch can ache in her toes feel stiff   ROS      Objective:  Physical Exam  Neuro ocular status intact negative Bevelyn Buckles' sign noted wound edges are coapted well plantar pain at the heel is much better with moderate arch pain noted and discomfort in the digits low-grade     Assessment:  Still healing from plantar fascial surgery left with moderate arch pain which could be a consistent finding with this release of the fascia but does not appear to be pathological currently     Plan:  H&P reviewed condition and recommended the utilization of heat ice therapy along with stretch with night splint.  Placed on oral diclofenac today and advised this patient on stretch and that we may do physical therapy for this depending on how it responds+  X-rays do not indicate that the arch has diminished in arch height from the previous procedure that was done and seems to be stable

## 2021-12-12 ENCOUNTER — Other Ambulatory Visit: Payer: Self-pay | Admitting: Podiatry

## 2021-12-12 DIAGNOSIS — Z9889 Other specified postprocedural states: Secondary | ICD-10-CM

## 2021-12-14 DIAGNOSIS — L814 Other melanin hyperpigmentation: Secondary | ICD-10-CM | POA: Diagnosis not present

## 2021-12-14 DIAGNOSIS — D485 Neoplasm of uncertain behavior of skin: Secondary | ICD-10-CM | POA: Diagnosis not present

## 2021-12-14 DIAGNOSIS — C44519 Basal cell carcinoma of skin of other part of trunk: Secondary | ICD-10-CM | POA: Diagnosis not present

## 2021-12-14 DIAGNOSIS — Z08 Encounter for follow-up examination after completed treatment for malignant neoplasm: Secondary | ICD-10-CM | POA: Diagnosis not present

## 2021-12-14 DIAGNOSIS — Z85820 Personal history of malignant melanoma of skin: Secondary | ICD-10-CM | POA: Diagnosis not present

## 2021-12-14 DIAGNOSIS — Z85828 Personal history of other malignant neoplasm of skin: Secondary | ICD-10-CM | POA: Diagnosis not present

## 2021-12-14 DIAGNOSIS — L821 Other seborrheic keratosis: Secondary | ICD-10-CM | POA: Diagnosis not present

## 2021-12-14 DIAGNOSIS — D225 Melanocytic nevi of trunk: Secondary | ICD-10-CM | POA: Diagnosis not present

## 2022-01-03 ENCOUNTER — Ambulatory Visit: Payer: Medicare HMO | Admitting: Podiatry

## 2022-01-04 ENCOUNTER — Encounter: Payer: Self-pay | Admitting: Podiatry

## 2022-01-04 ENCOUNTER — Ambulatory Visit (INDEPENDENT_AMBULATORY_CARE_PROVIDER_SITE_OTHER): Payer: Medicare HMO | Admitting: Podiatry

## 2022-01-04 DIAGNOSIS — C44519 Basal cell carcinoma of skin of other part of trunk: Secondary | ICD-10-CM | POA: Diagnosis not present

## 2022-01-04 DIAGNOSIS — M7752 Other enthesopathy of left foot: Secondary | ICD-10-CM | POA: Diagnosis not present

## 2022-01-04 MED ORDER — TRIAMCINOLONE ACETONIDE 10 MG/ML IJ SUSP
10.0000 mg | Freq: Once | INTRAMUSCULAR | Status: AC
  Administered 2022-01-04: 10 mg

## 2022-01-05 NOTE — Progress Notes (Signed)
Subjective:   Patient ID: Erin Parks, female   DOB: 72 y.o.   MRN: [id number]   HPI Patient states that she still has some discomfort in her left arch and her left ankle seems bothersome and her toes seem swollen.  The heel pain has resolved fairly well at this time and is only minimally discomforting and there is mild swelling in the foot itself   ROS      Objective:  Physical Exam  Neurovascular status found to be intact with patient found to have discomfort mostly centered now in the sinus tarsi left with patient also have a mild swelling of just the foot in general and discomfort into the arch region     Assessment:  Overall I do think she is making slow but gradual improvement and I am very hopeful over time that all the swelling will resolve with what appears to be inflammatory capsulitis of the sinus tarsi     Plan:  Reviewed all conditions we discussed physical therapy again and with mild slow improvement were just can to give it more time but I did go ahead today I did sterile prep and I injected the sinus tarsi 3 mg Kenalog 5 mg Xylocaine discussed the continuation of night splint with heat ice therapy and patient will contact me if symptoms persist for referral for physical therapy

## 2022-01-10 DIAGNOSIS — M542 Cervicalgia: Secondary | ICD-10-CM | POA: Diagnosis not present

## 2022-01-10 DIAGNOSIS — M5412 Radiculopathy, cervical region: Secondary | ICD-10-CM | POA: Diagnosis not present

## 2022-01-10 DIAGNOSIS — M9903 Segmental and somatic dysfunction of lumbar region: Secondary | ICD-10-CM | POA: Diagnosis not present

## 2022-01-10 DIAGNOSIS — M9901 Segmental and somatic dysfunction of cervical region: Secondary | ICD-10-CM | POA: Diagnosis not present

## 2022-01-24 DIAGNOSIS — M9901 Segmental and somatic dysfunction of cervical region: Secondary | ICD-10-CM | POA: Diagnosis not present

## 2022-01-24 DIAGNOSIS — M542 Cervicalgia: Secondary | ICD-10-CM | POA: Diagnosis not present

## 2022-01-24 DIAGNOSIS — M5412 Radiculopathy, cervical region: Secondary | ICD-10-CM | POA: Diagnosis not present

## 2022-01-24 DIAGNOSIS — M9903 Segmental and somatic dysfunction of lumbar region: Secondary | ICD-10-CM | POA: Diagnosis not present

## 2022-01-25 ENCOUNTER — Telehealth: Payer: Self-pay | Admitting: *Deleted

## 2022-01-25 DIAGNOSIS — Z9889 Other specified postprocedural states: Secondary | ICD-10-CM

## 2022-01-25 MED ORDER — DICLOFENAC SODIUM 75 MG PO TBEC: 75.0000 mg | DELAYED_RELEASE_TABLET | Freq: Two times a day (BID) | ORAL | 2 refills | Status: AC

## 2022-01-25 NOTE — Telephone Encounter (Signed)
Patient is calling to ask for a refill of the diclofenac-sodium-75 mg, please advise/ send to [facility]

## 2022-02-05 ENCOUNTER — Telehealth: Payer: Self-pay

## 2022-02-05 NOTE — Telephone Encounter (Signed)
Go ahead and put order for pt upstairs

## 2022-02-06 ENCOUNTER — Other Ambulatory Visit: Payer: Self-pay

## 2022-02-06 DIAGNOSIS — M7752 Other enthesopathy of left foot: Secondary | ICD-10-CM

## 2022-02-06 DIAGNOSIS — M722 Plantar fascial fibromatosis: Secondary | ICD-10-CM

## 2022-02-06 NOTE — Telephone Encounter (Signed)
Referral has been put in and will be sent tomorrow as I am working on the floor today.

## 2022-02-07 DIAGNOSIS — M542 Cervicalgia: Secondary | ICD-10-CM | POA: Diagnosis not present

## 2022-02-07 DIAGNOSIS — M5412 Radiculopathy, cervical region: Secondary | ICD-10-CM | POA: Diagnosis not present

## 2022-02-07 DIAGNOSIS — M9903 Segmental and somatic dysfunction of lumbar region: Secondary | ICD-10-CM | POA: Diagnosis not present

## 2022-02-07 DIAGNOSIS — M9901 Segmental and somatic dysfunction of cervical region: Secondary | ICD-10-CM | POA: Diagnosis not present

## 2022-02-12 DIAGNOSIS — M79672 Pain in left foot: Secondary | ICD-10-CM | POA: Diagnosis not present

## 2022-02-12 DIAGNOSIS — M25675 Stiffness of left foot, not elsewhere classified: Secondary | ICD-10-CM | POA: Diagnosis not present

## 2022-02-12 DIAGNOSIS — R2689 Other abnormalities of gait and mobility: Secondary | ICD-10-CM | POA: Diagnosis not present

## 2022-02-16 DIAGNOSIS — M79672 Pain in left foot: Secondary | ICD-10-CM | POA: Diagnosis not present

## 2022-02-16 DIAGNOSIS — M25675 Stiffness of left foot, not elsewhere classified: Secondary | ICD-10-CM | POA: Diagnosis not present

## 2022-02-16 DIAGNOSIS — R2689 Other abnormalities of gait and mobility: Secondary | ICD-10-CM | POA: Diagnosis not present

## 2022-02-19 DIAGNOSIS — M9903 Segmental and somatic dysfunction of lumbar region: Secondary | ICD-10-CM | POA: Diagnosis not present

## 2022-02-19 DIAGNOSIS — M79672 Pain in left foot: Secondary | ICD-10-CM | POA: Diagnosis not present

## 2022-02-19 DIAGNOSIS — M25675 Stiffness of left foot, not elsewhere classified: Secondary | ICD-10-CM | POA: Diagnosis not present

## 2022-02-19 DIAGNOSIS — M5412 Radiculopathy, cervical region: Secondary | ICD-10-CM | POA: Diagnosis not present

## 2022-02-19 DIAGNOSIS — M9901 Segmental and somatic dysfunction of cervical region: Secondary | ICD-10-CM | POA: Diagnosis not present

## 2022-02-19 DIAGNOSIS — R2689 Other abnormalities of gait and mobility: Secondary | ICD-10-CM | POA: Diagnosis not present

## 2022-02-19 DIAGNOSIS — M542 Cervicalgia: Secondary | ICD-10-CM | POA: Diagnosis not present

## 2022-02-20 DIAGNOSIS — I10 Essential (primary) hypertension: Secondary | ICD-10-CM | POA: Diagnosis not present

## 2022-02-21 DIAGNOSIS — M25675 Stiffness of left foot, not elsewhere classified: Secondary | ICD-10-CM | POA: Diagnosis not present

## 2022-02-21 DIAGNOSIS — M79672 Pain in left foot: Secondary | ICD-10-CM | POA: Diagnosis not present

## 2022-02-21 DIAGNOSIS — R2689 Other abnormalities of gait and mobility: Secondary | ICD-10-CM | POA: Diagnosis not present

## 2022-02-26 DIAGNOSIS — R2689 Other abnormalities of gait and mobility: Secondary | ICD-10-CM | POA: Diagnosis not present

## 2022-02-26 DIAGNOSIS — M25675 Stiffness of left foot, not elsewhere classified: Secondary | ICD-10-CM | POA: Diagnosis not present

## 2022-02-26 DIAGNOSIS — M79672 Pain in left foot: Secondary | ICD-10-CM | POA: Diagnosis not present

## 2022-02-26 DIAGNOSIS — M542 Cervicalgia: Secondary | ICD-10-CM | POA: Diagnosis not present

## 2022-02-28 DIAGNOSIS — M25675 Stiffness of left foot, not elsewhere classified: Secondary | ICD-10-CM | POA: Diagnosis not present

## 2022-02-28 DIAGNOSIS — M542 Cervicalgia: Secondary | ICD-10-CM | POA: Diagnosis not present

## 2022-02-28 DIAGNOSIS — M79672 Pain in left foot: Secondary | ICD-10-CM | POA: Diagnosis not present

## 2022-02-28 DIAGNOSIS — R2689 Other abnormalities of gait and mobility: Secondary | ICD-10-CM | POA: Diagnosis not present

## 2022-03-05 DIAGNOSIS — M5412 Radiculopathy, cervical region: Secondary | ICD-10-CM | POA: Diagnosis not present

## 2022-03-05 DIAGNOSIS — M542 Cervicalgia: Secondary | ICD-10-CM | POA: Diagnosis not present

## 2022-03-05 DIAGNOSIS — M9903 Segmental and somatic dysfunction of lumbar region: Secondary | ICD-10-CM | POA: Diagnosis not present

## 2022-03-05 DIAGNOSIS — M9901 Segmental and somatic dysfunction of cervical region: Secondary | ICD-10-CM | POA: Diagnosis not present

## 2022-03-07 DIAGNOSIS — M542 Cervicalgia: Secondary | ICD-10-CM | POA: Diagnosis not present

## 2022-03-07 DIAGNOSIS — M25675 Stiffness of left foot, not elsewhere classified: Secondary | ICD-10-CM | POA: Diagnosis not present

## 2022-03-07 DIAGNOSIS — R2689 Other abnormalities of gait and mobility: Secondary | ICD-10-CM | POA: Diagnosis not present

## 2022-03-07 DIAGNOSIS — M79672 Pain in left foot: Secondary | ICD-10-CM | POA: Diagnosis not present

## 2022-03-09 DIAGNOSIS — R2689 Other abnormalities of gait and mobility: Secondary | ICD-10-CM | POA: Diagnosis not present

## 2022-03-09 DIAGNOSIS — M25675 Stiffness of left foot, not elsewhere classified: Secondary | ICD-10-CM | POA: Diagnosis not present

## 2022-03-09 DIAGNOSIS — M79672 Pain in left foot: Secondary | ICD-10-CM | POA: Diagnosis not present

## 2022-03-09 DIAGNOSIS — M542 Cervicalgia: Secondary | ICD-10-CM | POA: Diagnosis not present

## 2022-03-13 DIAGNOSIS — M542 Cervicalgia: Secondary | ICD-10-CM | POA: Diagnosis not present

## 2022-03-13 DIAGNOSIS — M7532 Calcific tendinitis of left shoulder: Secondary | ICD-10-CM | POA: Diagnosis not present

## 2022-03-16 DIAGNOSIS — M542 Cervicalgia: Secondary | ICD-10-CM | POA: Diagnosis not present

## 2022-03-16 DIAGNOSIS — M79672 Pain in left foot: Secondary | ICD-10-CM | POA: Diagnosis not present

## 2022-03-16 DIAGNOSIS — R2689 Other abnormalities of gait and mobility: Secondary | ICD-10-CM | POA: Diagnosis not present

## 2022-03-16 DIAGNOSIS — M25675 Stiffness of left foot, not elsewhere classified: Secondary | ICD-10-CM | POA: Diagnosis not present

## 2022-03-21 DIAGNOSIS — M25675 Stiffness of left foot, not elsewhere classified: Secondary | ICD-10-CM | POA: Diagnosis not present

## 2022-03-21 DIAGNOSIS — M79672 Pain in left foot: Secondary | ICD-10-CM | POA: Diagnosis not present

## 2022-03-21 DIAGNOSIS — R2689 Other abnormalities of gait and mobility: Secondary | ICD-10-CM | POA: Diagnosis not present

## 2022-03-21 DIAGNOSIS — M542 Cervicalgia: Secondary | ICD-10-CM | POA: Diagnosis not present

## 2022-03-23 DIAGNOSIS — R2689 Other abnormalities of gait and mobility: Secondary | ICD-10-CM | POA: Diagnosis not present

## 2022-03-23 DIAGNOSIS — M25675 Stiffness of left foot, not elsewhere classified: Secondary | ICD-10-CM | POA: Diagnosis not present

## 2022-03-23 DIAGNOSIS — M79672 Pain in left foot: Secondary | ICD-10-CM | POA: Diagnosis not present

## 2022-03-23 DIAGNOSIS — M542 Cervicalgia: Secondary | ICD-10-CM | POA: Diagnosis not present

## 2022-03-26 DIAGNOSIS — M79672 Pain in left foot: Secondary | ICD-10-CM | POA: Diagnosis not present

## 2022-03-26 DIAGNOSIS — M25675 Stiffness of left foot, not elsewhere classified: Secondary | ICD-10-CM | POA: Diagnosis not present

## 2022-03-26 DIAGNOSIS — M542 Cervicalgia: Secondary | ICD-10-CM | POA: Diagnosis not present

## 2022-03-26 DIAGNOSIS — R2689 Other abnormalities of gait and mobility: Secondary | ICD-10-CM | POA: Diagnosis not present

## 2022-03-30 DIAGNOSIS — M47812 Spondylosis without myelopathy or radiculopathy, cervical region: Secondary | ICD-10-CM | POA: Diagnosis not present

## 2022-04-03 DIAGNOSIS — R252 Cramp and spasm: Secondary | ICD-10-CM | POA: Diagnosis not present

## 2022-04-03 DIAGNOSIS — E7800 Pure hypercholesterolemia, unspecified: Secondary | ICD-10-CM | POA: Diagnosis not present

## 2022-04-03 DIAGNOSIS — I10 Essential (primary) hypertension: Secondary | ICD-10-CM | POA: Diagnosis not present

## 2022-04-03 DIAGNOSIS — E611 Iron deficiency: Secondary | ICD-10-CM | POA: Diagnosis not present

## 2022-04-03 DIAGNOSIS — R7301 Impaired fasting glucose: Secondary | ICD-10-CM | POA: Diagnosis not present

## 2022-04-04 DIAGNOSIS — R252 Cramp and spasm: Secondary | ICD-10-CM | POA: Diagnosis not present

## 2022-04-04 DIAGNOSIS — R69 Illness, unspecified: Secondary | ICD-10-CM | POA: Diagnosis not present

## 2022-04-04 DIAGNOSIS — I10 Essential (primary) hypertension: Secondary | ICD-10-CM | POA: Diagnosis not present

## 2022-04-04 DIAGNOSIS — M79672 Pain in left foot: Secondary | ICD-10-CM | POA: Diagnosis not present

## 2022-04-04 DIAGNOSIS — E7800 Pure hypercholesterolemia, unspecified: Secondary | ICD-10-CM | POA: Diagnosis not present

## 2022-04-04 DIAGNOSIS — Z0000 Encounter for general adult medical examination without abnormal findings: Secondary | ICD-10-CM | POA: Diagnosis not present

## 2022-04-04 DIAGNOSIS — E611 Iron deficiency: Secondary | ICD-10-CM | POA: Diagnosis not present

## 2022-04-04 DIAGNOSIS — E039 Hypothyroidism, unspecified: Secondary | ICD-10-CM | POA: Diagnosis not present

## 2022-04-04 DIAGNOSIS — R2689 Other abnormalities of gait and mobility: Secondary | ICD-10-CM | POA: Diagnosis not present

## 2022-04-04 DIAGNOSIS — M542 Cervicalgia: Secondary | ICD-10-CM | POA: Diagnosis not present

## 2022-04-04 DIAGNOSIS — M25675 Stiffness of left foot, not elsewhere classified: Secondary | ICD-10-CM | POA: Diagnosis not present

## 2022-04-04 DIAGNOSIS — G4700 Insomnia, unspecified: Secondary | ICD-10-CM | POA: Diagnosis not present

## 2022-04-04 DIAGNOSIS — R7301 Impaired fasting glucose: Secondary | ICD-10-CM | POA: Diagnosis not present

## 2022-04-04 DIAGNOSIS — M810 Age-related osteoporosis without current pathological fracture: Secondary | ICD-10-CM | POA: Diagnosis not present

## 2022-04-06 DIAGNOSIS — M25675 Stiffness of left foot, not elsewhere classified: Secondary | ICD-10-CM | POA: Diagnosis not present

## 2022-04-06 DIAGNOSIS — M79672 Pain in left foot: Secondary | ICD-10-CM | POA: Diagnosis not present

## 2022-04-06 DIAGNOSIS — M542 Cervicalgia: Secondary | ICD-10-CM | POA: Diagnosis not present

## 2022-04-06 DIAGNOSIS — R2689 Other abnormalities of gait and mobility: Secondary | ICD-10-CM | POA: Diagnosis not present

## 2022-04-09 DIAGNOSIS — M79672 Pain in left foot: Secondary | ICD-10-CM | POA: Diagnosis not present

## 2022-04-09 DIAGNOSIS — M542 Cervicalgia: Secondary | ICD-10-CM | POA: Diagnosis not present

## 2022-04-09 DIAGNOSIS — M9901 Segmental and somatic dysfunction of cervical region: Secondary | ICD-10-CM | POA: Diagnosis not present

## 2022-04-09 DIAGNOSIS — M5412 Radiculopathy, cervical region: Secondary | ICD-10-CM | POA: Diagnosis not present

## 2022-04-09 DIAGNOSIS — M9903 Segmental and somatic dysfunction of lumbar region: Secondary | ICD-10-CM | POA: Diagnosis not present

## 2022-04-09 DIAGNOSIS — M25675 Stiffness of left foot, not elsewhere classified: Secondary | ICD-10-CM | POA: Diagnosis not present

## 2022-04-09 DIAGNOSIS — R2689 Other abnormalities of gait and mobility: Secondary | ICD-10-CM | POA: Diagnosis not present

## 2022-04-11 DIAGNOSIS — M79672 Pain in left foot: Secondary | ICD-10-CM | POA: Diagnosis not present

## 2022-04-11 DIAGNOSIS — M25675 Stiffness of left foot, not elsewhere classified: Secondary | ICD-10-CM | POA: Diagnosis not present

## 2022-04-11 DIAGNOSIS — M542 Cervicalgia: Secondary | ICD-10-CM | POA: Diagnosis not present

## 2022-04-11 DIAGNOSIS — R2689 Other abnormalities of gait and mobility: Secondary | ICD-10-CM | POA: Diagnosis not present

## 2022-04-12 DIAGNOSIS — Z0100 Encounter for examination of eyes and vision without abnormal findings: Secondary | ICD-10-CM | POA: Diagnosis not present

## 2022-04-18 DIAGNOSIS — R2689 Other abnormalities of gait and mobility: Secondary | ICD-10-CM | POA: Diagnosis not present

## 2022-04-18 DIAGNOSIS — M79672 Pain in left foot: Secondary | ICD-10-CM | POA: Diagnosis not present

## 2022-04-18 DIAGNOSIS — M25675 Stiffness of left foot, not elsewhere classified: Secondary | ICD-10-CM | POA: Diagnosis not present

## 2022-04-18 DIAGNOSIS — M542 Cervicalgia: Secondary | ICD-10-CM | POA: Diagnosis not present

## 2022-04-20 DIAGNOSIS — M25675 Stiffness of left foot, not elsewhere classified: Secondary | ICD-10-CM | POA: Diagnosis not present

## 2022-04-20 DIAGNOSIS — M79672 Pain in left foot: Secondary | ICD-10-CM | POA: Diagnosis not present

## 2022-04-20 DIAGNOSIS — M542 Cervicalgia: Secondary | ICD-10-CM | POA: Diagnosis not present

## 2022-04-20 DIAGNOSIS — R2689 Other abnormalities of gait and mobility: Secondary | ICD-10-CM | POA: Diagnosis not present

## 2022-04-23 DIAGNOSIS — M9903 Segmental and somatic dysfunction of lumbar region: Secondary | ICD-10-CM | POA: Diagnosis not present

## 2022-04-23 DIAGNOSIS — M9901 Segmental and somatic dysfunction of cervical region: Secondary | ICD-10-CM | POA: Diagnosis not present

## 2022-04-23 DIAGNOSIS — M542 Cervicalgia: Secondary | ICD-10-CM | POA: Diagnosis not present

## 2022-04-23 DIAGNOSIS — M5412 Radiculopathy, cervical region: Secondary | ICD-10-CM | POA: Diagnosis not present

## 2022-04-25 DIAGNOSIS — M542 Cervicalgia: Secondary | ICD-10-CM | POA: Diagnosis not present

## 2022-04-25 DIAGNOSIS — M79672 Pain in left foot: Secondary | ICD-10-CM | POA: Diagnosis not present

## 2022-04-25 DIAGNOSIS — R2689 Other abnormalities of gait and mobility: Secondary | ICD-10-CM | POA: Diagnosis not present

## 2022-04-25 DIAGNOSIS — M25675 Stiffness of left foot, not elsewhere classified: Secondary | ICD-10-CM | POA: Diagnosis not present

## 2022-04-27 DIAGNOSIS — M542 Cervicalgia: Secondary | ICD-10-CM | POA: Diagnosis not present

## 2022-04-27 DIAGNOSIS — M79672 Pain in left foot: Secondary | ICD-10-CM | POA: Diagnosis not present

## 2022-04-27 DIAGNOSIS — M25675 Stiffness of left foot, not elsewhere classified: Secondary | ICD-10-CM | POA: Diagnosis not present

## 2022-04-27 DIAGNOSIS — R2689 Other abnormalities of gait and mobility: Secondary | ICD-10-CM | POA: Diagnosis not present

## 2022-05-02 DIAGNOSIS — M25675 Stiffness of left foot, not elsewhere classified: Secondary | ICD-10-CM | POA: Diagnosis not present

## 2022-05-02 DIAGNOSIS — R2689 Other abnormalities of gait and mobility: Secondary | ICD-10-CM | POA: Diagnosis not present

## 2022-05-02 DIAGNOSIS — M79672 Pain in left foot: Secondary | ICD-10-CM | POA: Diagnosis not present

## 2022-05-02 DIAGNOSIS — M542 Cervicalgia: Secondary | ICD-10-CM | POA: Diagnosis not present

## 2022-05-07 DIAGNOSIS — M542 Cervicalgia: Secondary | ICD-10-CM | POA: Diagnosis not present

## 2022-05-07 DIAGNOSIS — M9903 Segmental and somatic dysfunction of lumbar region: Secondary | ICD-10-CM | POA: Diagnosis not present

## 2022-05-07 DIAGNOSIS — M9901 Segmental and somatic dysfunction of cervical region: Secondary | ICD-10-CM | POA: Diagnosis not present

## 2022-05-07 DIAGNOSIS — M5412 Radiculopathy, cervical region: Secondary | ICD-10-CM | POA: Diagnosis not present

## 2022-05-08 DIAGNOSIS — L958 Other vasculitis limited to the skin: Secondary | ICD-10-CM | POA: Diagnosis not present

## 2022-05-08 DIAGNOSIS — M542 Cervicalgia: Secondary | ICD-10-CM | POA: Diagnosis not present

## 2022-05-08 DIAGNOSIS — R2689 Other abnormalities of gait and mobility: Secondary | ICD-10-CM | POA: Diagnosis not present

## 2022-05-08 DIAGNOSIS — M47812 Spondylosis without myelopathy or radiculopathy, cervical region: Secondary | ICD-10-CM | POA: Diagnosis not present

## 2022-05-08 DIAGNOSIS — M79672 Pain in left foot: Secondary | ICD-10-CM | POA: Diagnosis not present

## 2022-05-08 DIAGNOSIS — M25675 Stiffness of left foot, not elsewhere classified: Secondary | ICD-10-CM | POA: Diagnosis not present

## 2022-05-08 DIAGNOSIS — L309 Dermatitis, unspecified: Secondary | ICD-10-CM | POA: Diagnosis not present

## 2022-05-22 DIAGNOSIS — Z85828 Personal history of other malignant neoplasm of skin: Secondary | ICD-10-CM | POA: Diagnosis not present

## 2022-05-22 DIAGNOSIS — M310 Hypersensitivity angiitis: Secondary | ICD-10-CM | POA: Diagnosis not present

## 2022-05-22 DIAGNOSIS — L821 Other seborrheic keratosis: Secondary | ICD-10-CM | POA: Diagnosis not present

## 2022-05-22 DIAGNOSIS — L080 Pyoderma: Secondary | ICD-10-CM | POA: Diagnosis not present

## 2022-05-22 DIAGNOSIS — Z08 Encounter for follow-up examination after completed treatment for malignant neoplasm: Secondary | ICD-10-CM | POA: Diagnosis not present

## 2022-05-30 DIAGNOSIS — M9901 Segmental and somatic dysfunction of cervical region: Secondary | ICD-10-CM | POA: Diagnosis not present

## 2022-05-30 DIAGNOSIS — M5412 Radiculopathy, cervical region: Secondary | ICD-10-CM | POA: Diagnosis not present

## 2022-05-30 DIAGNOSIS — M9903 Segmental and somatic dysfunction of lumbar region: Secondary | ICD-10-CM | POA: Diagnosis not present

## 2022-05-30 DIAGNOSIS — M542 Cervicalgia: Secondary | ICD-10-CM | POA: Diagnosis not present

## 2022-06-04 DIAGNOSIS — M542 Cervicalgia: Secondary | ICD-10-CM | POA: Diagnosis not present

## 2022-06-04 DIAGNOSIS — M9903 Segmental and somatic dysfunction of lumbar region: Secondary | ICD-10-CM | POA: Diagnosis not present

## 2022-06-04 DIAGNOSIS — M9901 Segmental and somatic dysfunction of cervical region: Secondary | ICD-10-CM | POA: Diagnosis not present

## 2022-06-04 DIAGNOSIS — M5412 Radiculopathy, cervical region: Secondary | ICD-10-CM | POA: Diagnosis not present

## 2022-06-05 DIAGNOSIS — Z48817 Encounter for surgical aftercare following surgery on the skin and subcutaneous tissue: Secondary | ICD-10-CM | POA: Diagnosis not present

## 2022-06-05 DIAGNOSIS — L239 Allergic contact dermatitis, unspecified cause: Secondary | ICD-10-CM | POA: Diagnosis not present

## 2022-06-18 DIAGNOSIS — M9901 Segmental and somatic dysfunction of cervical region: Secondary | ICD-10-CM | POA: Diagnosis not present

## 2022-06-18 DIAGNOSIS — M5412 Radiculopathy, cervical region: Secondary | ICD-10-CM | POA: Diagnosis not present

## 2022-06-18 DIAGNOSIS — M9903 Segmental and somatic dysfunction of lumbar region: Secondary | ICD-10-CM | POA: Diagnosis not present

## 2022-06-18 DIAGNOSIS — M542 Cervicalgia: Secondary | ICD-10-CM | POA: Diagnosis not present

## 2022-06-22 DIAGNOSIS — E890 Postprocedural hypothyroidism: Secondary | ICD-10-CM | POA: Diagnosis not present

## 2022-06-22 DIAGNOSIS — N951 Menopausal and female climacteric states: Secondary | ICD-10-CM | POA: Diagnosis not present

## 2022-06-22 DIAGNOSIS — M8580 Other specified disorders of bone density and structure, unspecified site: Secondary | ICD-10-CM | POA: Diagnosis not present

## 2022-06-22 DIAGNOSIS — I10 Essential (primary) hypertension: Secondary | ICD-10-CM | POA: Diagnosis not present

## 2022-06-22 DIAGNOSIS — E559 Vitamin D deficiency, unspecified: Secondary | ICD-10-CM | POA: Diagnosis not present

## 2022-06-27 DIAGNOSIS — M47812 Spondylosis without myelopathy or radiculopathy, cervical region: Secondary | ICD-10-CM | POA: Diagnosis not present

## 2022-06-27 DIAGNOSIS — M7532 Calcific tendinitis of left shoulder: Secondary | ICD-10-CM | POA: Diagnosis not present

## 2022-06-27 DIAGNOSIS — M542 Cervicalgia: Secondary | ICD-10-CM | POA: Diagnosis not present

## 2022-06-29 DIAGNOSIS — I10 Essential (primary) hypertension: Secondary | ICD-10-CM | POA: Diagnosis not present

## 2022-06-29 DIAGNOSIS — E559 Vitamin D deficiency, unspecified: Secondary | ICD-10-CM | POA: Diagnosis not present

## 2022-06-29 DIAGNOSIS — M810 Age-related osteoporosis without current pathological fracture: Secondary | ICD-10-CM | POA: Diagnosis not present

## 2022-06-29 DIAGNOSIS — E890 Postprocedural hypothyroidism: Secondary | ICD-10-CM | POA: Diagnosis not present

## 2022-06-29 DIAGNOSIS — M8580 Other specified disorders of bone density and structure, unspecified site: Secondary | ICD-10-CM | POA: Diagnosis not present

## 2022-06-29 DIAGNOSIS — D649 Anemia, unspecified: Secondary | ICD-10-CM | POA: Diagnosis not present

## 2022-06-29 DIAGNOSIS — N951 Menopausal and female climacteric states: Secondary | ICD-10-CM | POA: Diagnosis not present

## 2022-07-02 DIAGNOSIS — M9903 Segmental and somatic dysfunction of lumbar region: Secondary | ICD-10-CM | POA: Diagnosis not present

## 2022-07-02 DIAGNOSIS — M5412 Radiculopathy, cervical region: Secondary | ICD-10-CM | POA: Diagnosis not present

## 2022-07-02 DIAGNOSIS — M9901 Segmental and somatic dysfunction of cervical region: Secondary | ICD-10-CM | POA: Diagnosis not present

## 2022-07-02 DIAGNOSIS — M542 Cervicalgia: Secondary | ICD-10-CM | POA: Diagnosis not present

## 2022-07-09 DIAGNOSIS — M542 Cervicalgia: Secondary | ICD-10-CM | POA: Diagnosis not present

## 2022-07-09 DIAGNOSIS — M5412 Radiculopathy, cervical region: Secondary | ICD-10-CM | POA: Diagnosis not present

## 2022-07-09 DIAGNOSIS — M9903 Segmental and somatic dysfunction of lumbar region: Secondary | ICD-10-CM | POA: Diagnosis not present

## 2022-07-09 DIAGNOSIS — M9901 Segmental and somatic dysfunction of cervical region: Secondary | ICD-10-CM | POA: Diagnosis not present

## 2022-07-23 DIAGNOSIS — M9903 Segmental and somatic dysfunction of lumbar region: Secondary | ICD-10-CM | POA: Diagnosis not present

## 2022-07-23 DIAGNOSIS — M5412 Radiculopathy, cervical region: Secondary | ICD-10-CM | POA: Diagnosis not present

## 2022-07-23 DIAGNOSIS — M9901 Segmental and somatic dysfunction of cervical region: Secondary | ICD-10-CM | POA: Diagnosis not present

## 2022-07-23 DIAGNOSIS — M542 Cervicalgia: Secondary | ICD-10-CM | POA: Diagnosis not present

## 2022-07-27 ENCOUNTER — Other Ambulatory Visit: Payer: Self-pay | Admitting: Physician Assistant

## 2022-07-27 DIAGNOSIS — L820 Inflamed seborrheic keratosis: Secondary | ICD-10-CM | POA: Diagnosis not present

## 2022-07-27 DIAGNOSIS — D485 Neoplasm of uncertain behavior of skin: Secondary | ICD-10-CM | POA: Diagnosis not present

## 2022-07-27 DIAGNOSIS — L538 Other specified erythematous conditions: Secondary | ICD-10-CM | POA: Diagnosis not present

## 2022-07-27 DIAGNOSIS — L309 Dermatitis, unspecified: Secondary | ICD-10-CM | POA: Diagnosis not present

## 2022-08-06 DIAGNOSIS — M9903 Segmental and somatic dysfunction of lumbar region: Secondary | ICD-10-CM | POA: Diagnosis not present

## 2022-08-06 DIAGNOSIS — M5412 Radiculopathy, cervical region: Secondary | ICD-10-CM | POA: Diagnosis not present

## 2022-08-06 DIAGNOSIS — M9901 Segmental and somatic dysfunction of cervical region: Secondary | ICD-10-CM | POA: Diagnosis not present

## 2022-08-06 DIAGNOSIS — M542 Cervicalgia: Secondary | ICD-10-CM | POA: Diagnosis not present

## 2022-08-07 DIAGNOSIS — M810 Age-related osteoporosis without current pathological fracture: Secondary | ICD-10-CM | POA: Diagnosis not present

## 2022-08-20 DIAGNOSIS — M9901 Segmental and somatic dysfunction of cervical region: Secondary | ICD-10-CM | POA: Diagnosis not present

## 2022-08-20 DIAGNOSIS — M9903 Segmental and somatic dysfunction of lumbar region: Secondary | ICD-10-CM | POA: Diagnosis not present

## 2022-08-20 DIAGNOSIS — M542 Cervicalgia: Secondary | ICD-10-CM | POA: Diagnosis not present

## 2022-08-20 DIAGNOSIS — M5412 Radiculopathy, cervical region: Secondary | ICD-10-CM | POA: Diagnosis not present

## 2022-09-18 DIAGNOSIS — D225 Melanocytic nevi of trunk: Secondary | ICD-10-CM | POA: Diagnosis not present

## 2022-09-18 DIAGNOSIS — L538 Other specified erythematous conditions: Secondary | ICD-10-CM | POA: Diagnosis not present

## 2022-09-18 DIAGNOSIS — L820 Inflamed seborrheic keratosis: Secondary | ICD-10-CM | POA: Diagnosis not present

## 2022-09-18 DIAGNOSIS — L821 Other seborrheic keratosis: Secondary | ICD-10-CM | POA: Diagnosis not present

## 2022-09-18 DIAGNOSIS — I788 Other diseases of capillaries: Secondary | ICD-10-CM | POA: Diagnosis not present

## 2022-09-18 DIAGNOSIS — L111 Transient acantholytic dermatosis [Grover]: Secondary | ICD-10-CM | POA: Diagnosis not present

## 2022-09-18 DIAGNOSIS — L814 Other melanin hyperpigmentation: Secondary | ICD-10-CM | POA: Diagnosis not present

## 2022-09-27 DIAGNOSIS — M542 Cervicalgia: Secondary | ICD-10-CM | POA: Diagnosis not present

## 2022-09-27 DIAGNOSIS — M7532 Calcific tendinitis of left shoulder: Secondary | ICD-10-CM | POA: Diagnosis not present

## 2022-10-25 ENCOUNTER — Ambulatory Visit (INDEPENDENT_AMBULATORY_CARE_PROVIDER_SITE_OTHER): Payer: Medicare HMO

## 2022-10-25 ENCOUNTER — Ambulatory Visit: Payer: Medicare HMO | Admitting: Podiatry

## 2022-10-25 DIAGNOSIS — M778 Other enthesopathies, not elsewhere classified: Secondary | ICD-10-CM

## 2022-10-25 DIAGNOSIS — M722 Plantar fascial fibromatosis: Secondary | ICD-10-CM | POA: Diagnosis not present

## 2022-10-25 MED ORDER — HYDROCORTISONE 0.5 % EX CREA: 1.0000 | TOPICAL_CREAM | Freq: Two times a day (BID) | CUTANEOUS | 0 refills | Status: AC

## 2022-10-25 NOTE — Progress Notes (Signed)
Subjective:   Patient ID: Erin Parks, female   DOB: 72 y.o.   MRN: [id number]   HPI Patient presents stating she was concerned about some irritation within the left arch and the incision site on the inside stating that just in the last week it has been a little bit irritated.  Has done great with surgery minimal discomfort   ROS      Objective:  Physical Exam  Neurovascular status intact negative Homans' sign noted slight irritation within the arch but I think it is more due to just overuse and does not indicate nodular formation currently with irritation of the left incision which may be due to weather change with no indications of drainage of pathology      Assessment:  I do not see these as being pathological currently as the pain is very mild and nowhere near the intensity of the surgery that we did last year and she is very active     Plan:  Reviewed and I have recommended the continuation of conservative care and if that were to form more discomfort we may need to do some small cortisone injection for but hold off currently and see if these do not resolve on their own with questions answered today

## 2022-10-31 ENCOUNTER — Other Ambulatory Visit: Payer: Self-pay | Admitting: Physician Assistant

## 2022-10-31 DIAGNOSIS — L111 Transient acantholytic dermatosis [Grover]: Secondary | ICD-10-CM | POA: Diagnosis not present

## 2022-10-31 DIAGNOSIS — L309 Dermatitis, unspecified: Secondary | ICD-10-CM | POA: Diagnosis not present

## 2022-10-31 DIAGNOSIS — L0101 Non-bullous impetigo: Secondary | ICD-10-CM | POA: Diagnosis not present

## 2022-10-31 DIAGNOSIS — L821 Other seborrheic keratosis: Secondary | ICD-10-CM | POA: Diagnosis not present

## 2022-11-05 LAB — DERMATOLOGY PATHOLOGY

## 2022-11-08 DIAGNOSIS — M25512 Pain in left shoulder: Secondary | ICD-10-CM | POA: Diagnosis not present

## 2022-11-14 DIAGNOSIS — M7532 Calcific tendinitis of left shoulder: Secondary | ICD-10-CM | POA: Diagnosis not present

## 2022-11-15 DIAGNOSIS — H5203 Hypermetropia, bilateral: Secondary | ICD-10-CM | POA: Diagnosis not present

## 2022-11-22 DIAGNOSIS — Z1231 Encounter for screening mammogram for malignant neoplasm of breast: Secondary | ICD-10-CM | POA: Diagnosis not present

## 2022-11-26 DIAGNOSIS — M25512 Pain in left shoulder: Secondary | ICD-10-CM | POA: Diagnosis not present

## 2022-11-27 DIAGNOSIS — E890 Postprocedural hypothyroidism: Secondary | ICD-10-CM | POA: Diagnosis not present

## 2022-12-21 DIAGNOSIS — M25512 Pain in left shoulder: Secondary | ICD-10-CM | POA: Diagnosis not present

## 2023-01-15 DIAGNOSIS — I10 Essential (primary) hypertension: Secondary | ICD-10-CM | POA: Diagnosis not present

## 2023-01-15 DIAGNOSIS — E559 Vitamin D deficiency, unspecified: Secondary | ICD-10-CM | POA: Diagnosis not present

## 2023-01-22 DIAGNOSIS — N951 Menopausal and female climacteric states: Secondary | ICD-10-CM | POA: Diagnosis not present

## 2023-01-22 DIAGNOSIS — E559 Vitamin D deficiency, unspecified: Secondary | ICD-10-CM | POA: Diagnosis not present

## 2023-01-22 DIAGNOSIS — M810 Age-related osteoporosis without current pathological fracture: Secondary | ICD-10-CM | POA: Diagnosis not present

## 2023-01-22 DIAGNOSIS — E890 Postprocedural hypothyroidism: Secondary | ICD-10-CM | POA: Diagnosis not present

## 2023-01-22 DIAGNOSIS — I10 Essential (primary) hypertension: Secondary | ICD-10-CM | POA: Diagnosis not present

## 2023-02-12 DIAGNOSIS — M810 Age-related osteoporosis without current pathological fracture: Secondary | ICD-10-CM | POA: Diagnosis not present

## 2023-03-20 DIAGNOSIS — M25512 Pain in left shoulder: Secondary | ICD-10-CM | POA: Diagnosis not present

## 2023-03-20 DIAGNOSIS — M47812 Spondylosis without myelopathy or radiculopathy, cervical region: Secondary | ICD-10-CM | POA: Diagnosis not present

## 2023-04-10 DIAGNOSIS — Z23 Encounter for immunization: Secondary | ICD-10-CM | POA: Diagnosis not present

## 2023-04-10 DIAGNOSIS — R7301 Impaired fasting glucose: Secondary | ICD-10-CM | POA: Diagnosis not present

## 2023-04-10 DIAGNOSIS — Z0000 Encounter for general adult medical examination without abnormal findings: Secondary | ICD-10-CM | POA: Diagnosis not present

## 2023-04-10 DIAGNOSIS — E7800 Pure hypercholesterolemia, unspecified: Secondary | ICD-10-CM | POA: Diagnosis not present

## 2023-04-10 DIAGNOSIS — R7303 Prediabetes: Secondary | ICD-10-CM | POA: Diagnosis not present

## 2023-04-10 DIAGNOSIS — F411 Generalized anxiety disorder: Secondary | ICD-10-CM | POA: Diagnosis not present

## 2023-04-10 DIAGNOSIS — E039 Hypothyroidism, unspecified: Secondary | ICD-10-CM | POA: Diagnosis not present

## 2023-04-10 DIAGNOSIS — G4700 Insomnia, unspecified: Secondary | ICD-10-CM | POA: Diagnosis not present

## 2023-04-10 DIAGNOSIS — M810 Age-related osteoporosis without current pathological fracture: Secondary | ICD-10-CM | POA: Diagnosis not present

## 2023-04-10 DIAGNOSIS — I10 Essential (primary) hypertension: Secondary | ICD-10-CM | POA: Diagnosis not present

## 2023-04-18 ENCOUNTER — Encounter: Payer: Self-pay | Admitting: Podiatry

## 2023-04-18 ENCOUNTER — Ambulatory Visit (INDEPENDENT_AMBULATORY_CARE_PROVIDER_SITE_OTHER): Admitting: Podiatry

## 2023-04-18 ENCOUNTER — Ambulatory Visit (INDEPENDENT_AMBULATORY_CARE_PROVIDER_SITE_OTHER)

## 2023-04-18 DIAGNOSIS — G8918 Other acute postprocedural pain: Secondary | ICD-10-CM

## 2023-04-18 DIAGNOSIS — M25512 Pain in left shoulder: Secondary | ICD-10-CM | POA: Diagnosis not present

## 2023-04-18 DIAGNOSIS — M7532 Calcific tendinitis of left shoulder: Secondary | ICD-10-CM | POA: Diagnosis not present

## 2023-04-18 DIAGNOSIS — M722 Plantar fascial fibromatosis: Secondary | ICD-10-CM | POA: Diagnosis not present

## 2023-04-18 MED ORDER — TRIAMCINOLONE ACETONIDE 10 MG/ML IJ SUSP
10.0000 mg | Freq: Once | INTRAMUSCULAR | Status: AC
  Administered 2023-04-18: 10 mg via INTRA_ARTICULAR

## 2023-04-21 NOTE — Progress Notes (Signed)
 Subjective:   Patient ID: Erin Parks, female   DOB: 73 y.o.   MRN: [id number]   HPI Patient presents to the incision on the inside of the left heel and states it has been irritated and at times can burn.  Not noted any drainage   ROS      Objective:  Physical Exam  Neurovascular status intact diminishment of the plantar fascial inflammation that she dealt with prior to surgery with the patient found to have irritative like tissue formation inside with mild medial fascial inflammation around the area     Assessment:  Difficult condition with possibility for a small amount of scar tissue versus a small amount of medial inflammatory fasciitis      Plan:  H&P reviewed today I went ahead to careful injection of the medial fascial band 3 mg Dexasone Kenalog  5 mg Xylocaine and advised that we may need to excise the scar and revise if symptoms persist  X-rays were negative for signs of depression of the arch spur formation or pathology around the area of pain

## 2023-05-21 DIAGNOSIS — M542 Cervicalgia: Secondary | ICD-10-CM | POA: Diagnosis not present

## 2023-05-21 DIAGNOSIS — M7532 Calcific tendinitis of left shoulder: Secondary | ICD-10-CM | POA: Diagnosis not present

## 2023-05-28 DIAGNOSIS — H18413 Arcus senilis, bilateral: Secondary | ICD-10-CM | POA: Diagnosis not present

## 2023-05-28 DIAGNOSIS — H2513 Age-related nuclear cataract, bilateral: Secondary | ICD-10-CM | POA: Diagnosis not present

## 2023-05-28 DIAGNOSIS — H25043 Posterior subcapsular polar age-related cataract, bilateral: Secondary | ICD-10-CM | POA: Diagnosis not present

## 2023-05-28 DIAGNOSIS — H25013 Cortical age-related cataract, bilateral: Secondary | ICD-10-CM | POA: Diagnosis not present

## 2023-05-28 DIAGNOSIS — H2512 Age-related nuclear cataract, left eye: Secondary | ICD-10-CM | POA: Diagnosis not present

## 2023-06-14 DIAGNOSIS — H2513 Age-related nuclear cataract, bilateral: Secondary | ICD-10-CM | POA: Diagnosis not present

## 2023-07-02 DIAGNOSIS — E559 Vitamin D deficiency, unspecified: Secondary | ICD-10-CM | POA: Diagnosis not present

## 2023-07-02 DIAGNOSIS — M810 Age-related osteoporosis without current pathological fracture: Secondary | ICD-10-CM | POA: Diagnosis not present

## 2023-07-02 DIAGNOSIS — E890 Postprocedural hypothyroidism: Secondary | ICD-10-CM | POA: Diagnosis not present

## 2023-07-02 DIAGNOSIS — I10 Essential (primary) hypertension: Secondary | ICD-10-CM | POA: Diagnosis not present

## 2023-07-02 DIAGNOSIS — N951 Menopausal and female climacteric states: Secondary | ICD-10-CM | POA: Diagnosis not present

## 2023-07-24 DIAGNOSIS — H2512 Age-related nuclear cataract, left eye: Secondary | ICD-10-CM | POA: Diagnosis not present

## 2023-07-25 DIAGNOSIS — H2511 Age-related nuclear cataract, right eye: Secondary | ICD-10-CM | POA: Diagnosis not present

## 2023-08-08 ENCOUNTER — Ambulatory Visit (INDEPENDENT_AMBULATORY_CARE_PROVIDER_SITE_OTHER): Admitting: Podiatry

## 2023-08-08 ENCOUNTER — Encounter: Payer: Self-pay | Admitting: Podiatry

## 2023-08-08 DIAGNOSIS — D3610 Benign neoplasm of peripheral nerves and autonomic nervous system, unspecified: Secondary | ICD-10-CM

## 2023-08-08 DIAGNOSIS — G629 Polyneuropathy, unspecified: Secondary | ICD-10-CM | POA: Diagnosis not present

## 2023-08-08 NOTE — Progress Notes (Signed)
 Subjective:   Patient ID: Erin Parks Cough, female   DOB: 73 y.o.   MRN: [id number]   HPI Patient presents with several concerns with still the incision from previous fascial surgery left and numbness on the bottom outside of both feet   ROS      Objective:  Physical Exam  Neurovascular status found to be intact muscle strength adequate range of motion adequate with the patient found to have slight numbness on the lateral plantar aspect of both feet around the 3rd, 4th and 5th metatarsals and proximal to this with most of this occurring recently the history of the issue with the right.  Left shows slight irritation of the incision site but improved from previous visit with minimal plantar symptoms     Assessment:  Possibility for neuropathic like symptomatology bilateral versus neuroma versus back issues and small defect in the incision left but healed well     Plan:  H&P both conditions reviewed and neuropathy I do not recommend current treatment but I did review neurologist and other modalities and if it gets worse consideration for oral medication.  Do not treat fascial inflammation left as it continues to improve and is no longer causing her daily issues

## 2023-08-21 DIAGNOSIS — M25551 Pain in right hip: Secondary | ICD-10-CM | POA: Diagnosis not present

## 2023-08-21 DIAGNOSIS — M7532 Calcific tendinitis of left shoulder: Secondary | ICD-10-CM | POA: Diagnosis not present

## 2023-08-26 DIAGNOSIS — M810 Age-related osteoporosis without current pathological fracture: Secondary | ICD-10-CM | POA: Diagnosis not present

## 2023-08-28 DIAGNOSIS — H2511 Age-related nuclear cataract, right eye: Secondary | ICD-10-CM | POA: Diagnosis not present

## 2023-09-18 DIAGNOSIS — Z85828 Personal history of other malignant neoplasm of skin: Secondary | ICD-10-CM | POA: Diagnosis not present

## 2023-09-18 DIAGNOSIS — Z08 Encounter for follow-up examination after completed treatment for malignant neoplasm: Secondary | ICD-10-CM | POA: Diagnosis not present

## 2023-09-18 DIAGNOSIS — L814 Other melanin hyperpigmentation: Secondary | ICD-10-CM | POA: Diagnosis not present

## 2023-09-18 DIAGNOSIS — L309 Dermatitis, unspecified: Secondary | ICD-10-CM | POA: Diagnosis not present

## 2023-09-18 DIAGNOSIS — L821 Other seborrheic keratosis: Secondary | ICD-10-CM | POA: Diagnosis not present

## 2023-10-09 DIAGNOSIS — M7532 Calcific tendinitis of left shoulder: Secondary | ICD-10-CM | POA: Diagnosis not present

## 2023-10-23 DIAGNOSIS — R55 Syncope and collapse: Secondary | ICD-10-CM | POA: Diagnosis not present

## 2023-10-23 DIAGNOSIS — Z23 Encounter for immunization: Secondary | ICD-10-CM | POA: Diagnosis not present

## 2023-11-11 DIAGNOSIS — M7532 Calcific tendinitis of left shoulder: Secondary | ICD-10-CM | POA: Diagnosis not present

## 2023-11-11 DIAGNOSIS — M25512 Pain in left shoulder: Secondary | ICD-10-CM | POA: Diagnosis not present

## 2023-11-11 DIAGNOSIS — M47817 Spondylosis without myelopathy or radiculopathy, lumbosacral region: Secondary | ICD-10-CM | POA: Diagnosis not present

## 2023-11-26 DIAGNOSIS — Z1231 Encounter for screening mammogram for malignant neoplasm of breast: Secondary | ICD-10-CM | POA: Diagnosis not present

## 2023-11-26 DIAGNOSIS — Z01419 Encounter for gynecological examination (general) (routine) without abnormal findings: Secondary | ICD-10-CM | POA: Diagnosis not present

## 2023-12-03 ENCOUNTER — Other Ambulatory Visit: Payer: Self-pay | Admitting: Obstetrics and Gynecology

## 2023-12-03 DIAGNOSIS — R928 Other abnormal and inconclusive findings on diagnostic imaging of breast: Secondary | ICD-10-CM

## 2023-12-05 DIAGNOSIS — M25512 Pain in left shoulder: Secondary | ICD-10-CM | POA: Diagnosis not present

## 2023-12-09 ENCOUNTER — Other Ambulatory Visit: Payer: Self-pay

## 2023-12-09 ENCOUNTER — Emergency Department (HOSPITAL_COMMUNITY)
Admission: EM | Admit: 2023-12-09 | Discharge: 2023-12-09 | Disposition: A | Discharge status: Home / Self Care | Attending: Emergency Medicine | Admitting: Emergency Medicine

## 2023-12-09 DIAGNOSIS — M25519 Pain in unspecified shoulder: Secondary | ICD-10-CM | POA: Diagnosis not present

## 2023-12-09 DIAGNOSIS — R55 Syncope and collapse: Secondary | ICD-10-CM | POA: Insufficient documentation

## 2023-12-09 DIAGNOSIS — I10 Essential (primary) hypertension: Secondary | ICD-10-CM | POA: Diagnosis not present

## 2023-12-09 DIAGNOSIS — M25512 Pain in left shoulder: Secondary | ICD-10-CM | POA: Diagnosis not present

## 2023-12-09 LAB — CBC
HCT: 38.6 % (ref 36.0–46.0)
Hemoglobin: 13.1 g/dL (ref 12.0–15.0)
MCH: 31.9 pg (ref 26.0–34.0)
MCHC: 33.9 g/dL (ref 30.0–36.0)
MCV: 93.9 fL (ref 80.0–100.0)
Platelets: 297 K/uL (ref 150–400)
RBC: 4.11 MIL/uL (ref 3.87–5.11)
RDW: 12.5 % (ref 11.5–15.5)
WBC: 8.5 K/uL (ref 4.0–10.5)
nRBC: 0 % (ref 0.0–0.2)

## 2023-12-09 LAB — COMPREHENSIVE METABOLIC PANEL WITH GFR
ALT: 15 U/L (ref 0–44)
AST: 19 U/L (ref 15–41)
Albumin: 4.4 g/dL (ref 3.5–5.0)
Alkaline Phosphatase: 84 U/L (ref 38–126)
Anion gap: 10 (ref 5–15)
BUN: 14 mg/dL (ref 8–23)
CO2: 23 mmol/L (ref 22–32)
Calcium: 9.4 mg/dL (ref 8.9–10.3)
Chloride: 105 mmol/L (ref 98–111)
Creatinine, Ser: 0.69 mg/dL (ref 0.44–1.00)
GFR, Estimated: >60 mL/min (ref >60)
Glucose, Bld: 110 mg/dL — ABNORMAL HIGH (ref 70–99)
Potassium: 4.3 mmol/L (ref 3.5–5.1)
Sodium: 138 mmol/L (ref 135–145)
Total Bilirubin: 0.8 mg/dL (ref 0.0–1.2)
Total Protein: 7.3 g/dL (ref 6.5–8.1)

## 2023-12-09 LAB — CBG MONITORING, ED: Glucose-Capillary: 104 mg/dL — ABNORMAL HIGH (ref 70–99)

## 2023-12-09 MED ORDER — OXYCODONE HCL 5 MG PO TABS
5.0000 mg | ORAL_TABLET | Freq: Once | ORAL | Status: AC
  Administered 2023-12-09: 5 mg via ORAL
  Filled 2023-12-09: qty 1

## 2023-12-09 MED ORDER — ACETAMINOPHEN 500 MG PO TABS
1000.0000 mg | ORAL_TABLET | Freq: Once | ORAL | Status: AC
  Administered 2023-12-09: 1000 mg via ORAL
  Filled 2023-12-09: qty 2

## 2023-12-09 MED ORDER — MORPHINE SULFATE 15 MG PO TABS: 7.5000 mg | ORAL_TABLET | ORAL | 0 refills | Status: AC | PRN

## 2023-12-09 MED ORDER — METHYLPREDNISOLONE 4 MG PO TBPK: ORAL_TABLET | ORAL | 0 refills | Status: AC

## 2023-12-09 MED ORDER — DICLOFENAC SODIUM 1 % EX GEL: 4.0000 g | Freq: Four times a day (QID) | CUTANEOUS | 0 refills | Status: AC

## 2023-12-09 MED ORDER — KETOROLAC TROMETHAMINE 15 MG/ML IJ SOLN
15.0000 mg | Freq: Once | INTRAMUSCULAR | Status: AC
  Administered 2023-12-09: 15 mg via INTRAVENOUS
  Filled 2023-12-09: qty 1

## 2023-12-09 NOTE — Discharge Instructions (Signed)
 Eat and drink to care for the next couple days.  Please return for chest pain difficulty breathing sudden onset headache neck pain or if you pass out again.  Please let your orthopedic surgeon know about your worsening pain.  Take the steroids as prescribed.  Use the gel as prescribed. Also take tylenol  1000mg (2 extra strength) four times a day.

## 2023-12-09 NOTE — ED Triage Notes (Signed)
 Patient BIB EMS from home c/o syncope episode tonight. Patient report she LOC and fell in the floor. Patient denies hitting her head. Patient denies blood thinner. Patient report nausea denies vomiting. Patient states she started taking tramadol for her Elbow pain 2 hours ago before she passed out.

## 2023-12-09 NOTE — ED Provider Notes (Signed)
 [place] EMERGENCY DEPARTMENT AT [facility] Provider Note   CSN: [id number] Arrival date & time: 12/09/23  8165     Patient presents with: Shoulder Pain and Loss of Consciousness   Erin Parks is a 73 y.o. female.   73 yo F with a chief complaints of an event where she lost consciousness.  She tells me that she was feeding her dog and she went to pick up his food bowl to bring to him and she suddenly felt unwell.  She knew that she was get a pass out at that time and continued to try and take the food to her dog and she hit her left hand on the cabinet and fell to the ground.  She then had trouble getting up on her own.  She has been struggling with left shoulder discomfort for some time.  Is been getting injections with orthopedics.  This last injection does not seem to have helped much.  She has been taking pain medicines intermittently for this.  Tried a muscle relaxer yesterday tried an NSAID today.  Denies recent injury to it with the exception of this fall.  She denies any chest pain difficulty breathing headache or neck pain prior to the fall.  She thinks maybe the pain from her shoulder was so bad it made her pass out.   Shoulder Pain Loss of Consciousness      Prior to Admission medications   Medication Sig Start Date End Date Taking? Authorizing Provider  diclofenac  Sodium (VOLTAREN ) 1 % GEL Apply 4 g topically 4 (four) times daily. 12/09/23  Yes Emil Share, DO  methylPREDNISolone  (MEDROL  DOSEPAK) 4 MG TBPK tablet Day 1: 8mg  before breakfast, 4 mg after lunch, 4 mg after supper, and 8 mg at bedtime Day 2: 4 mg before breakfast, 4 mg after lunch, 4 mg  after supper, and 8 mg  at bedtime Day 3:  4 mg  before breakfast, 4 mg  after lunch, 4 mg after supper, and 4 mg  at bedtime Day 4: 4 mg  before breakfast, 4 mg  after lunch, and 4 mg at bedtime Day 5: 4 mg  before breakfast and 4 mg at bedtime Day 6: 4 mg  before breakfast 12/09/23  Yes Nyara Capell, DO   morphine  (MSIR) 15 MG tablet Take 0.5 tablets (7.5 mg total) by mouth every 4 (four) hours as needed. 12/09/23  Yes Emil Share, DO  acyclovir cream (ZOVIRAX) 5 % APPLY TO THE AFFECTED AREA(S) BY TOPICAL ROUTE 5 TIMES PER DAY 06/24/14   [provider]  acyclovir ointment (ZOVIRAX) 5 %     [provider]  ALPRAZolam (XANAX) 0.5 MG tablet     [provider]  diclofenac  (VOLTAREN ) 75 MG EC tablet Take 1 tablet (75 mg total) by mouth 2 (two) times daily. 01/25/22   Tobie Franky SQUIBB, DPM  hydrocortisone  cream 0.5 % Apply 1 Application topically 2 (two) times daily. 10/25/22   Magdalen Pasco RAMAN, DPM  Multiple Vitamins-Minerals (MULTIVITAMIN WOMEN 50+) TABS as directed Orally    [provider]  thyroid  (NP THYROID ) 60 MG tablet Take 1 tablet by mouth daily.    [provider]    Allergies: Sulfonamide derivatives and Codeine    Review of Systems  Cardiovascular:  Positive for syncope.    Updated Vital Signs BP (!) 188/148 (BP Location: Right Arm)   Pulse 74   Temp 98.7 F (37.1 C) (Oral)   Resp 15  SpO2 100%   Physical Exam Vitals and nursing note reviewed.  Constitutional:      General: She is not in acute distress.    Appearance: She is well-developed. She is not diaphoretic.  HENT:     Head: Normocephalic and atraumatic.  Eyes:     Pupils: Pupils are equal, round, and reactive to light.  Cardiovascular:     Rate and Rhythm: Normal rate and regular rhythm.     Heart sounds: No murmur heard.    No friction rub. No gallop.  Pulmonary:     Effort: Pulmonary effort is normal.     Breath sounds: No wheezing or rales.  Abdominal:     General: There is no distension.     Palpations: Abdomen is soft.     Tenderness: There is no abdominal tenderness.  Musculoskeletal:        General: Tenderness present.     Cervical back: Normal range of motion and neck supple.     Comments: Some tenderness about the [facility] joint.  Patient with diffuse pain  about palpation of the shoulder.  Limited range of motion due to discomfort.  Pulse motor and sensation intact distally.  Patient has a small skin tear to the dorsal aspect of the left forearm.  Skin:    General: Skin is warm and dry.  Neurological:     Mental Status: She is alert and oriented to person, place, and time.  Psychiatric:        Behavior: Behavior normal.     (all labs ordered are listed, but only abnormal results are displayed) Labs Reviewed  COMPREHENSIVE METABOLIC PANEL WITH GFR - Abnormal; Notable for the following components:      Result Value   Glucose, Bld 110 (*)    All other components within normal limits  CBG MONITORING, ED - Abnormal; Notable for the following components:   Glucose-Capillary 104 (*)    All other components within normal limits  CBC    EKG: EKG Interpretation Date/Time:  Monday December 09 2023 18:57:10 EST Ventricular Rate:  76 PR Interval:  152 QRS Duration:  83 QT Interval:  382 QTC Calculation: 430 R Axis:   60  Text Interpretation: Sinus rhythm Ventricular premature complex No old tracing to compare Confirmed by Emil Share [phone]) on 12/09/2023 7:50:34 PM  Radiology: No results found.   Procedures   Medications Ordered in the ED  ketorolac  (TORADOL ) 15 MG/ML injection 15 mg (15 mg Intravenous Given 12/09/23 2011)  acetaminophen  (TYLENOL ) tablet 1,000 mg (1,000 mg Oral Given 12/09/23 2011)  oxyCODONE  (Oxy IR/ROXICODONE ) immediate release tablet 5 mg (5 mg Oral Given 12/09/23 2011)                                    Medical Decision Making Amount and/or Complexity of Data Reviewed Labs: ordered.  Risk OTC drugs. Prescription drug management.   73 yo F with a chief complaint of a syncopal event.  By history it sounds like it was vasovagal.  She is feeling quite a bit better now.  Her primary complaint actually is not that she passed out as her uncontrolled left shoulder pain.  It sounds like she takes medicines  intermittently as she describes taking a muscle relaxant 1 time yesterday and taking an NSAID today.  She tells me that no medicines work for this but injections seem to work pretty well.  She has  a prescription for lidocaine patches but has not tried them yet.  Workup here without significant electrolyte abnormalities no acute anemia.  EKG with 1 PVC but without other concerning finding.  Will ambulate the patient here.  Treat symptoms.  Reassess.  Patient feeling better would like to go home.  9:31 PM:  I have discussed the diagnosis/risks/treatment options with the patient.  Evaluation and diagnostic testing in the emergency department does not suggest an emergent condition requiring admission or immediate intervention beyond what has been performed at this time.  They will follow up with PCP. We also discussed returning to the ED immediately if new or worsening sx occur. We discussed the sx which are most concerning (e.g., sudden worsening pain, fever, inability to tolerate by mouth) that necessitate immediate return. Medications administered to the patient during their visit and any new prescriptions provided to the patient are listed below.  Medications given during this visit Medications  ketorolac  (TORADOL ) 15 MG/ML injection 15 mg (15 mg Intravenous Given 12/09/23 2011)  acetaminophen  (TYLENOL ) tablet 1,000 mg (1,000 mg Oral Given 12/09/23 2011)  oxyCODONE  (Oxy IR/ROXICODONE ) immediate release tablet 5 mg (5 mg Oral Given 12/09/23 2011)     The patient appears reasonably screen and/or stabilized for discharge and I doubt any other medical condition or other [facility] requiring further screening, evaluation, or treatment in the ED at this time prior to discharge.        Final diagnoses:  Syncope and collapse  Acute pain of left shoulder    ED Discharge Orders          Ordered    morphine  (MSIR) 15 MG tablet  Every 4 hours PRN        12/09/23 2124    methylPREDNISolone  (MEDROL  DOSEPAK)  4 MG TBPK tablet        12/09/23 2124    diclofenac  Sodium (VOLTAREN ) 1 % GEL  4 times daily        12/09/23 2124               Emil Share, DO 12/09/23 2131

## 2023-12-12 ENCOUNTER — Other Ambulatory Visit

## 2023-12-12 ENCOUNTER — Inpatient Hospital Stay
Admission: RE | Admit: 2023-12-12 | Discharge: 2023-12-12 | Attending: Obstetrics and Gynecology | Admitting: Obstetrics and Gynecology

## 2023-12-12 DIAGNOSIS — R928 Other abnormal and inconclusive findings on diagnostic imaging of breast: Secondary | ICD-10-CM

## 2023-12-16 DIAGNOSIS — R55 Syncope and collapse: Secondary | ICD-10-CM | POA: Diagnosis not present

## 2023-12-16 DIAGNOSIS — S51819A Laceration without foreign body of unspecified forearm, initial encounter: Secondary | ICD-10-CM | POA: Diagnosis not present

## 2023-12-18 ENCOUNTER — Encounter: Payer: Self-pay | Admitting: Neurology

## 2023-12-22 DIAGNOSIS — M25512 Pain in left shoulder: Secondary | ICD-10-CM | POA: Diagnosis not present

## 2023-12-23 DIAGNOSIS — R55 Syncope and collapse: Secondary | ICD-10-CM | POA: Diagnosis not present

## 2023-12-23 NOTE — Progress Notes (Addendum)
 Erin Parks                                          MRN: [id number]   01/14/2024   The [facility] Quality Team Specialist reviewed this patient medical record for the purposes of chart review for care gap closure. The following were reviewed: chart review for care gap closure-controlling blood pressure.    [facility] Quality Team

## 2024-02-13 ENCOUNTER — Ambulatory Visit: Payer: Self-pay | Admitting: Neurology
